# Patient Record
Sex: Female | Born: 1980 | Race: White | Hispanic: No | Marital: Married | Smoking: Never smoker
Health system: Southern US, Community
[De-identification: ages and names within clinical notes are randomized; demographics above are authoritative.]

---

## 2018-04-05 DIAGNOSIS — J302 Other seasonal allergic rhinitis: Secondary | ICD-10-CM | POA: Insufficient documentation

## 2019-08-27 DIAGNOSIS — J302 Other seasonal allergic rhinitis: Secondary | ICD-10-CM | POA: Insufficient documentation

## 2020-06-24 DIAGNOSIS — Z0000 Encounter for general adult medical examination without abnormal findings: Secondary | ICD-10-CM | POA: Insufficient documentation

## 2021-06-24 ENCOUNTER — Ambulatory Visit (INDEPENDENT_AMBULATORY_CARE_PROVIDER_SITE_OTHER): Payer: 59 | Admitting: Family Medicine

## 2021-06-24 ENCOUNTER — Other Ambulatory Visit: Payer: Self-pay | Admitting: *Deleted

## 2021-06-24 ENCOUNTER — Encounter: Payer: Self-pay | Admitting: Family Medicine

## 2021-06-24 VITALS — BP 128/83 | HR 85 | Temp 98.0°F | Resp 16 | Ht 65.0 in | Wt 151.6 lb

## 2021-06-24 DIAGNOSIS — Z7689 Persons encountering health services in other specified circumstances: Secondary | ICD-10-CM

## 2021-06-24 DIAGNOSIS — Z13228 Encounter for screening for other metabolic disorders: Secondary | ICD-10-CM

## 2021-06-24 DIAGNOSIS — Z0000 Encounter for general adult medical examination without abnormal findings: Secondary | ICD-10-CM | POA: Diagnosis not present

## 2021-06-24 DIAGNOSIS — Z1329 Encounter for screening for other suspected endocrine disorder: Secondary | ICD-10-CM

## 2021-06-24 DIAGNOSIS — Z130 Encounter for screening for diseases of the blood and blood-forming organs and certain disorders involving the immune mechanism: Secondary | ICD-10-CM | POA: Diagnosis not present

## 2021-06-24 DIAGNOSIS — Z13220 Encounter for screening for lipoid disorders: Secondary | ICD-10-CM

## 2021-06-24 DIAGNOSIS — Z1231 Encounter for screening mammogram for malignant neoplasm of breast: Secondary | ICD-10-CM

## 2021-06-24 NOTE — Progress Notes (Signed)
Patient is here to est care with provider. Patient has no concerns at the moment. ?

## 2021-06-25 ENCOUNTER — Encounter: Payer: Self-pay | Admitting: Family Medicine

## 2021-06-25 LAB — CBC WITH DIFFERENTIAL/PLATELET
Basophils Absolute: 0.1 10*3/uL (ref 0.0–0.2)
Basos: 1 %
EOS (ABSOLUTE): 0.2 10*3/uL (ref 0.0–0.4)
Eos: 2 %
Hematocrit: 43.1 % (ref 34.0–46.6)
Hemoglobin: 14.2 g/dL (ref 11.1–15.9)
Immature Grans (Abs): 0 10*3/uL (ref 0.0–0.1)
Immature Granulocytes: 0 %
Lymphocytes Absolute: 2.5 10*3/uL (ref 0.7–3.1)
Lymphs: 28 %
MCH: 29.6 pg (ref 26.6–33.0)
MCHC: 32.9 g/dL (ref 31.5–35.7)
MCV: 90 fL (ref 79–97)
Monocytes Absolute: 0.6 10*3/uL (ref 0.1–0.9)
Monocytes: 6 %
Neutrophils Absolute: 5.7 10*3/uL (ref 1.4–7.0)
Neutrophils: 63 %
Platelets: 236 10*3/uL (ref 150–450)
RBC: 4.79 x10E6/uL (ref 3.77–5.28)
RDW: 12 % (ref 11.7–15.4)
WBC: 9 10*3/uL (ref 3.4–10.8)

## 2021-06-25 LAB — LIPID PANEL
Chol/HDL Ratio: 3 ratio (ref 0.0–4.4)
Cholesterol, Total: 201 mg/dL — ABNORMAL HIGH (ref 100–199)
HDL: 67 mg/dL (ref >39)
LDL Chol Calc (NIH): 112 mg/dL — ABNORMAL HIGH (ref 0–99)
Triglycerides: 123 mg/dL (ref 0–149)
VLDL Cholesterol Cal: 22 mg/dL (ref 5–40)

## 2021-06-25 LAB — CMP14+EGFR
ALT: 9 IU/L (ref 0–32)
AST: 15 IU/L (ref 0–40)
Albumin/Globulin Ratio: 2.5 — ABNORMAL HIGH (ref 1.2–2.2)
Albumin: 4.9 g/dL — ABNORMAL HIGH (ref 3.8–4.8)
Alkaline Phosphatase: 50 IU/L (ref 44–121)
BUN/Creatinine Ratio: 13 (ref 9–23)
BUN: 10 mg/dL (ref 6–24)
Bilirubin Total: 0.4 mg/dL (ref 0.0–1.2)
CO2: 23 mmol/L (ref 20–29)
Calcium: 9.4 mg/dL (ref 8.7–10.2)
Chloride: 104 mmol/L (ref 96–106)
Creatinine, Ser: 0.75 mg/dL (ref 0.57–1.00)
Globulin, Total: 2 g/dL (ref 1.5–4.5)
Glucose: 82 mg/dL (ref 70–99)
Potassium: 4.6 mmol/L (ref 3.5–5.2)
Sodium: 141 mmol/L (ref 134–144)
Total Protein: 6.9 g/dL (ref 6.0–8.5)
eGFR: 103 mL/min/{1.73_m2} (ref >59)

## 2021-06-25 LAB — TSH: TSH: 2.59 u[IU]/mL (ref 0.450–4.500)

## 2021-06-25 LAB — HEMOGLOBIN A1C
Est. average glucose Bld gHb Est-mCnc: 105 mg/dL
Hgb A1c MFr Bld: 5.3 % (ref 4.8–5.6)

## 2021-06-25 NOTE — Progress Notes (Signed)
? ?New Patient Office Visit ? ?Subjective   ? ?Patient ID: Brandy Padilla, female    DOB: 11-Sep-1980  Age: 41 y.o. MRN: [id number] ? ?CC:  ?Chief Complaint  ?Patient presents with  ? Establish Care  ? ? ?HPI ?Brandy Padilla presents to establish care and for a routine annual exam.  ? ? ?Outpatient Encounter Medications as of 06/24/2021  ?Medication Sig  ? benzonatate (TESSALON) 100 MG capsule take 1 capsule by oral route 3 times every day as needed for cough  ? cetirizine (ZYRTEC) 10 MG tablet   ? norgestimate-ethinyl estradiol (ORTHO-CYCLEN) 0.25-35 MG-MCG tablet Take 1 tablet by mouth daily.  ? ?No facility-administered encounter medications on file as of 06/24/2021.  ? ? ?History reviewed. No pertinent past medical history. ? ?History reviewed. No pertinent surgical history. ? ?Family History  ?Problem Relation Age of Onset  ? Alcohol abuse Father   ? Depression Father   ? Diabetes Maternal Grandmother   ? Cancer Paternal Grandmother   ? ? ?Social History  ? ?Socioeconomic History  ? Marital status: Married  ?  Spouse name: Not on file  ? Number of children: Not on file  ? Years of education: Not on file  ? Highest education level: Not on file  ?Occupational History  ? Not on file  ?Tobacco Use  ? Smoking status: Never  ? Smokeless tobacco: Not on file  ?Substance and Sexual Activity  ? Alcohol use: Yes  ?  Alcohol/week: 1.0 standard drink  ?  Types: 1 Cans of beer per week  ? Drug use: Never  ? Sexual activity: Yes  ?  Birth control/protection: Condom, Pill  ?  Comment: Married. One partner.  ?Other Topics Concern  ? Not on file  ?Social History Narrative  ? Not on file  ? ?Social Determinants of Health  ? ?Financial Resource Strain: Not on file  ?Food Insecurity: Not on file  ?Transportation Needs: Not on file  ?Physical Activity: Not on file  ?Stress: Not on file  ?Social Connections: Not on file  ?Intimate Partner Violence: Not on file  ? ? ?Review of Systems  ?All other systems reviewed and  are negative. ? ?  ? ? ?Objective   ? ?BP 128/83   Pulse 85   Temp 98 ?F (36.7 ?C) (Oral)   Resp 16   Ht 5' 5"  (1.651 m)   Wt 151 lb 9.6 oz (68.8 kg)   SpO2 98%   BMI 25.23 kg/m?  ? ?Physical Exam ?Vitals and nursing note reviewed.  ?Constitutional:   ?   General: She is not in acute distress. ?Cardiovascular:  ?   Rate and Rhythm: Normal rate and regular rhythm.  ?Pulmonary:  ?   Effort: Pulmonary effort is normal.  ?   Breath sounds: Normal breath sounds.  ?Abdominal:  ?   Palpations: Abdomen is soft.  ?   Tenderness: There is no abdominal tenderness.  ?Neurological:  ?   General: No focal deficit present.  ?   Mental Status: She is alert and oriented to person, place, and time.  ? ? ? ?  ? ?Assessment & Plan:  ? ?1. Annual physical exam ?Routine labs ordered ?- CMP14+EGFR ? ?2. Screening for deficiency anemia ? ?- CBC with Differential ? ?3. Screening for endocrine/metabolic/immunity disorders ? ?- TSH ?- Hemoglobin A1c ? ?4. Screening for lipid disorders ? ?- Lipid Panel ? ?5. Encounter for screening mammogram for malignant neoplasm of breast ?Referral for mammogram ?- MM  Digital Screening; Future ? ?6. Encounter to establish care ? ? ? ? ?No follow-ups on file.  ? ?Becky Sax, MD ? ? ?

## 2021-07-01 ENCOUNTER — Other Ambulatory Visit: Payer: Self-pay | Admitting: Family Medicine

## 2021-07-01 DIAGNOSIS — Z1231 Encounter for screening mammogram for malignant neoplasm of breast: Secondary | ICD-10-CM

## 2021-07-06 ENCOUNTER — Ambulatory Visit
Admission: RE | Admit: 2021-07-06 | Discharge: 2021-07-06 | Disposition: A | Discharge status: Home / Self Care | Payer: 59 | Source: Ambulatory Visit | Attending: Family Medicine | Admitting: Family Medicine

## 2021-07-06 DIAGNOSIS — Z1231 Encounter for screening mammogram for malignant neoplasm of breast: Secondary | ICD-10-CM

## 2021-08-26 ENCOUNTER — Other Ambulatory Visit: Payer: Self-pay | Admitting: Family Medicine

## 2021-08-27 ENCOUNTER — Other Ambulatory Visit (HOSPITAL_COMMUNITY): Payer: Self-pay

## 2021-08-27 MED ORDER — NORGESTIMATE-ETH ESTRADIOL 0.25-35 MG-MCG PO TABS
1.0000 | ORAL_TABLET | Freq: Every day | ORAL | 11 refills | Status: DC
Start: 2021-08-27 — End: 2022-09-22
  Filled 2021-08-27 – 2021-12-22 (×4): qty 28, 28d supply, fill #0

## 2021-09-02 ENCOUNTER — Other Ambulatory Visit (HOSPITAL_COMMUNITY): Payer: Self-pay

## 2021-12-22 ENCOUNTER — Other Ambulatory Visit (HOSPITAL_COMMUNITY): Payer: Self-pay

## 2021-12-24 ENCOUNTER — Other Ambulatory Visit (HOSPITAL_COMMUNITY): Payer: Self-pay

## 2022-04-29 ENCOUNTER — Encounter: Payer: Self-pay | Admitting: Family Medicine

## 2022-05-07 IMAGING — MG MM DIGITAL SCREENING BILAT W/ TOMO AND CAD
6 of 10 series · 6 of 30 positions shown · non-contrast
Comparison: Previous exam 06/19/2020 from [place], [place].

CLINICAL DATA: Screening.

EXAM:
DIGITAL SCREENING BILATERAL MAMMOGRAM WITH TOMOSYNTHESIS AND CAD
TECHNIQUE: Bilateral screening digital craniocaudal and mediolateral oblique
mammograms were obtained. Bilateral screening digital breast
tomosynthesis was performed. The images were evaluated with
computer-aided detection.

[L MLO synth-2D]
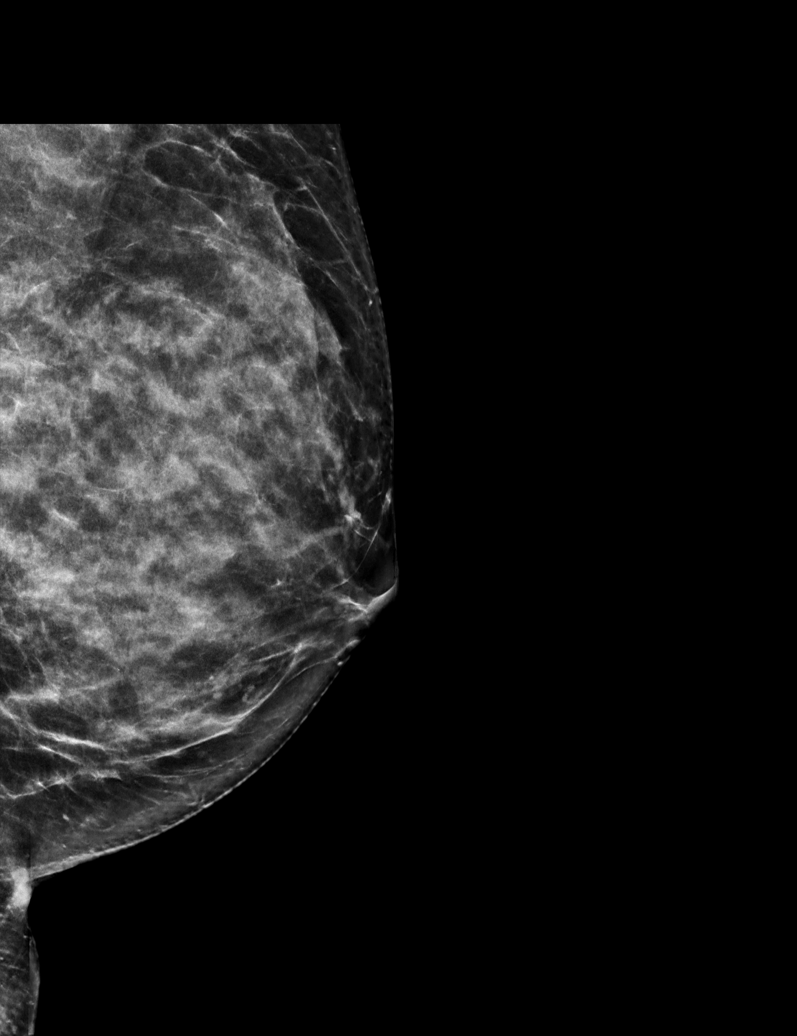

[R MLO synth-2D]
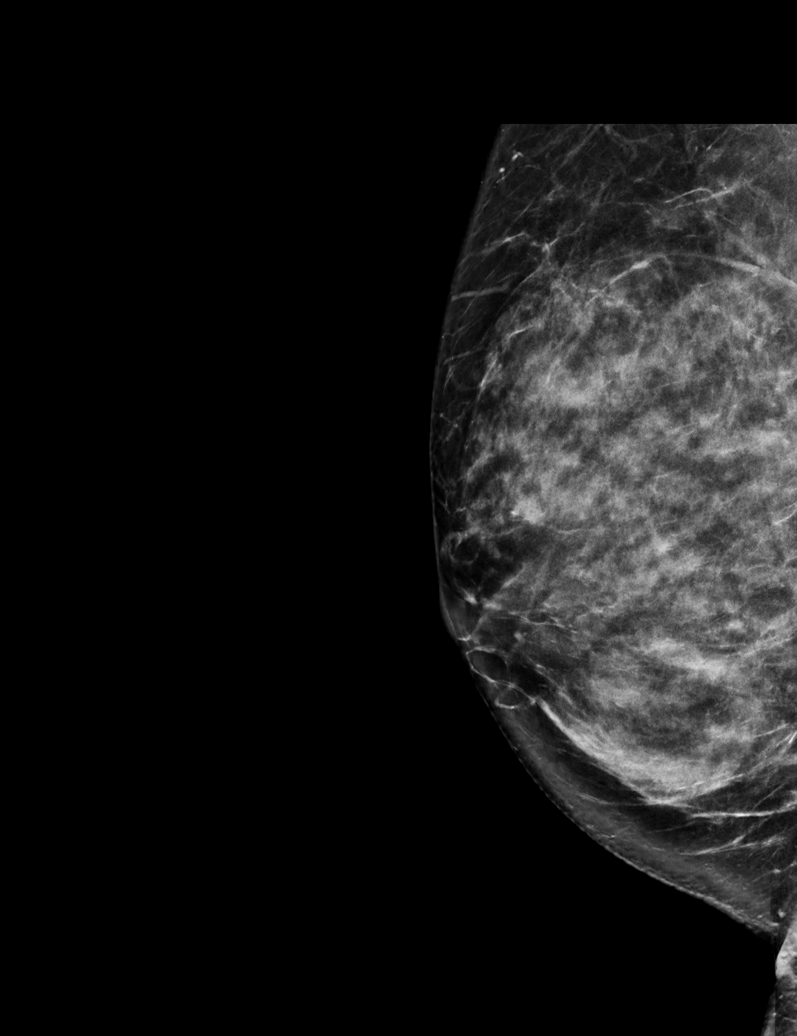

[R CC synth-2D (1 of 2)]
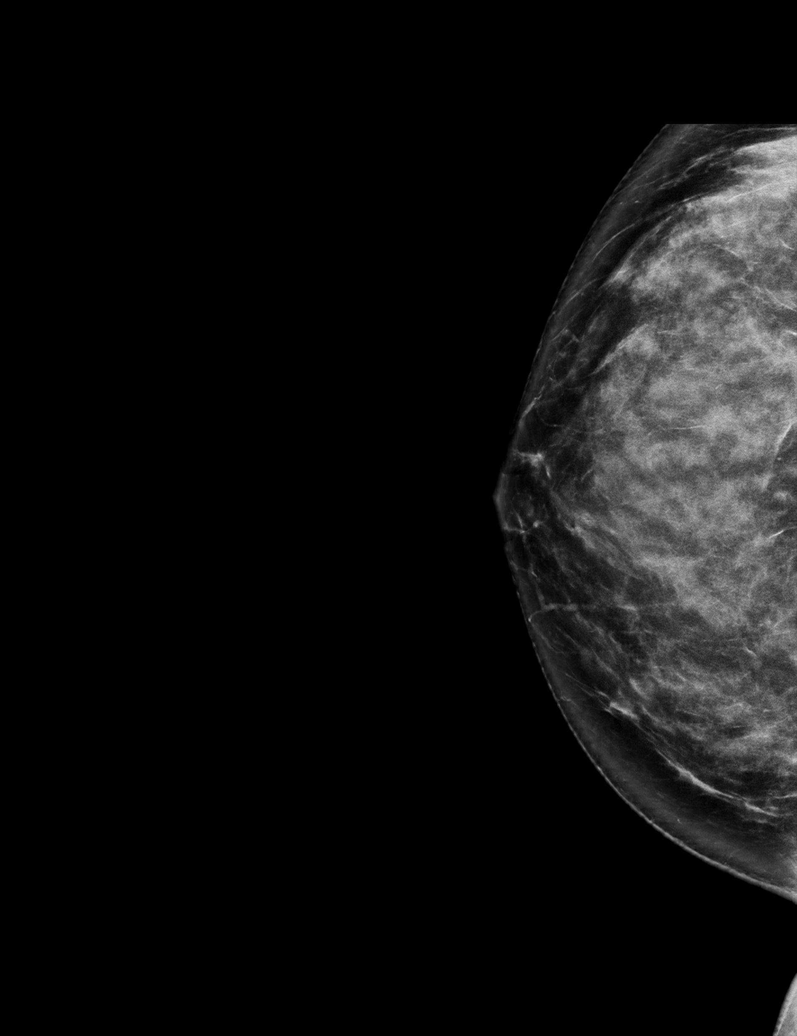

[L CC synth-2D]
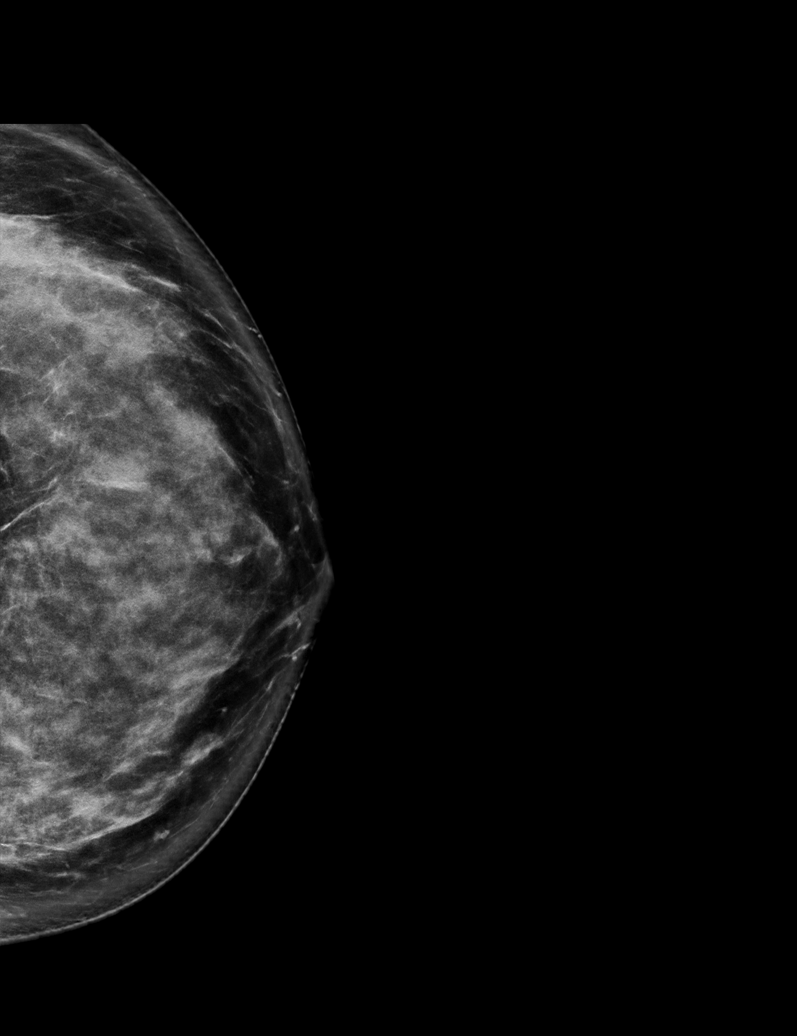

[R CC synth-2D (2 of 2)]
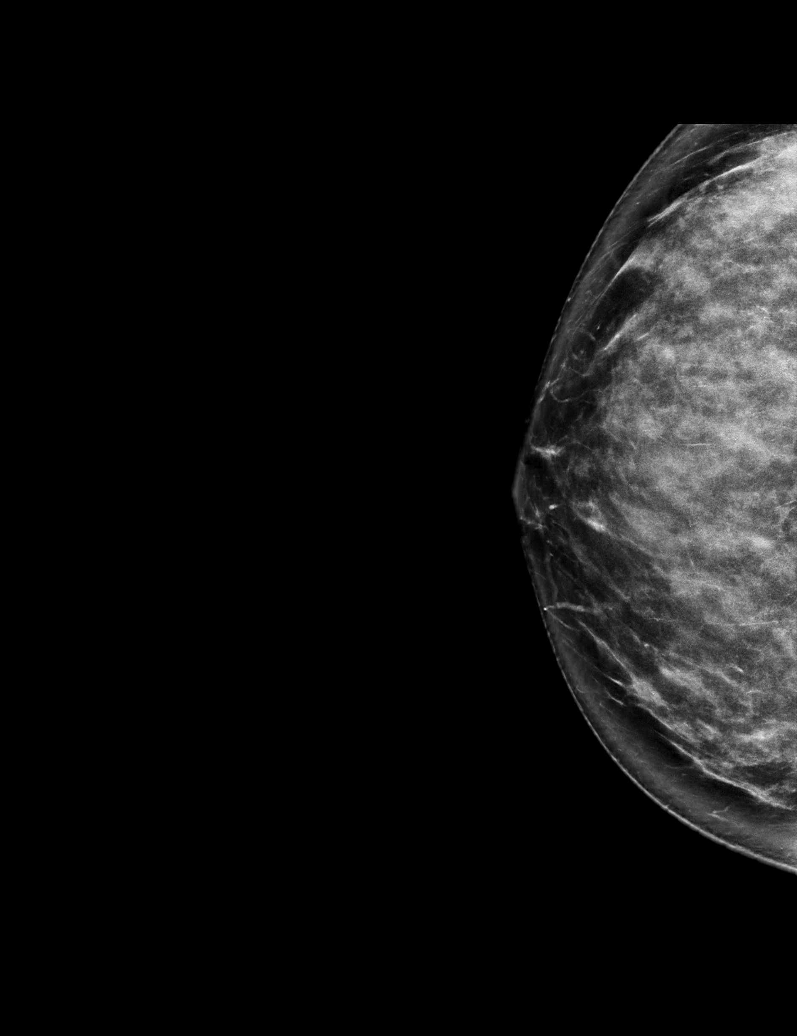

[R CC tomo · tomo slice 37/73.0]
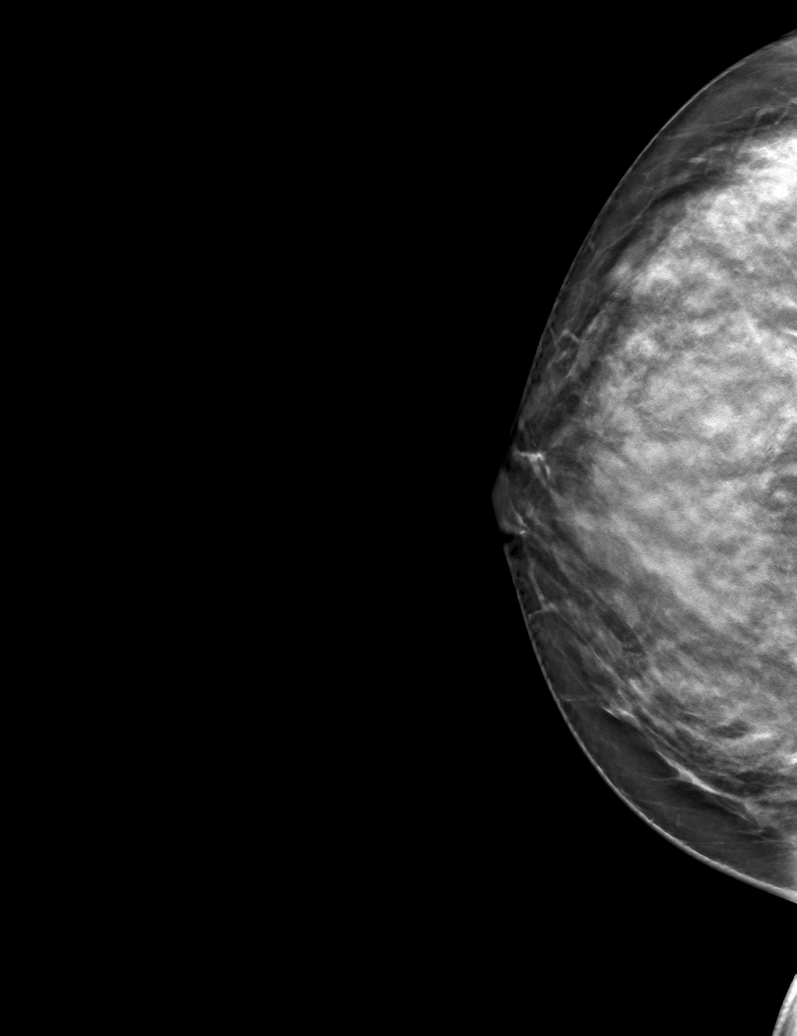

[6 of 30 positions shown; findings below may reference images not displayed]

Awaiting
the prior outside mammograms accounts for the delay in this report.

ACR Breast Density Category d: The breast tissue is extremely dense,
which lowers the sensitivity of mammography
FINDINGS: There are no findings suspicious for malignancy.
IMPRESSION: No mammographic evidence of malignancy. A result letter of this
screening mammogram will be mailed directly to the patient.

RECOMMENDATION:
Screening mammogram in one year. (Code:[id number]-H-0IL)

BI-RADS CATEGORY  1: Negative.

## 2022-06-29 ENCOUNTER — Other Ambulatory Visit: Payer: Self-pay | Admitting: Family Medicine

## 2022-06-29 DIAGNOSIS — Z0000 Encounter for general adult medical examination without abnormal findings: Secondary | ICD-10-CM

## 2022-06-30 ENCOUNTER — Ambulatory Visit (INDEPENDENT_AMBULATORY_CARE_PROVIDER_SITE_OTHER): Payer: 59 | Admitting: Family Medicine

## 2022-06-30 ENCOUNTER — Other Ambulatory Visit (HOSPITAL_COMMUNITY)
Admission: RE | Admit: 2022-06-30 | Discharge: 2022-06-30 | Disposition: A | Discharge status: Home / Self Care | Payer: 59 | Source: Ambulatory Visit | Attending: Family Medicine | Admitting: Family Medicine

## 2022-06-30 ENCOUNTER — Encounter: Payer: Self-pay | Admitting: Family Medicine

## 2022-06-30 VITALS — BP 122/81 | HR 76 | Temp 98.1°F | Resp 16 | Ht 65.0 in | Wt 153.0 lb

## 2022-06-30 DIAGNOSIS — Z0000 Encounter for general adult medical examination without abnormal findings: Secondary | ICD-10-CM

## 2022-06-30 DIAGNOSIS — Z13220 Encounter for screening for lipoid disorders: Secondary | ICD-10-CM | POA: Diagnosis not present

## 2022-06-30 DIAGNOSIS — Z1159 Encounter for screening for other viral diseases: Secondary | ICD-10-CM | POA: Diagnosis not present

## 2022-06-30 DIAGNOSIS — Z01419 Encounter for gynecological examination (general) (routine) without abnormal findings: Secondary | ICD-10-CM

## 2022-06-30 DIAGNOSIS — Z114 Encounter for screening for human immunodeficiency virus [HIV]: Secondary | ICD-10-CM | POA: Diagnosis not present

## 2022-06-30 DIAGNOSIS — Z130 Encounter for screening for diseases of the blood and blood-forming organs and certain disorders involving the immune mechanism: Secondary | ICD-10-CM | POA: Diagnosis not present

## 2022-07-01 LAB — CBC WITH DIFFERENTIAL/PLATELET
Basophils Absolute: 0.1 10*3/uL (ref 0.0–0.2)
Basos: 1 %
EOS (ABSOLUTE): 0.2 10*3/uL (ref 0.0–0.4)
Eos: 2 %
Hematocrit: 47.5 % — ABNORMAL HIGH (ref 34.0–46.6)
Hemoglobin: 15.6 g/dL (ref 11.1–15.9)
Immature Grans (Abs): 0 10*3/uL (ref 0.0–0.1)
Immature Granulocytes: 0 %
Lymphocytes Absolute: 2.1 10*3/uL (ref 0.7–3.1)
Lymphs: 29 %
MCH: 29.5 pg (ref 26.6–33.0)
MCHC: 32.8 g/dL (ref 31.5–35.7)
MCV: 90 fL (ref 79–97)
Monocytes Absolute: 0.4 10*3/uL (ref 0.1–0.9)
Monocytes: 5 %
Neutrophils Absolute: 4.6 10*3/uL (ref 1.4–7.0)
Neutrophils: 63 %
Platelets: 266 10*3/uL (ref 150–450)
RBC: 5.28 x10E6/uL (ref 3.77–5.28)
RDW: 12.4 % (ref 11.7–15.4)
WBC: 7.3 10*3/uL (ref 3.4–10.8)

## 2022-07-01 LAB — CERVICOVAGINAL ANCILLARY ONLY
Bacterial Vaginitis (gardnerella): NEGATIVE
Candida Glabrata: NEGATIVE
Candida Vaginitis: NEGATIVE
Chlamydia: NEGATIVE
Comment: NEGATIVE
Comment: NEGATIVE
Comment: NEGATIVE
Comment: NEGATIVE
Comment: NEGATIVE
Comment: NORMAL
Neisseria Gonorrhea: NEGATIVE
Trichomonas: NEGATIVE

## 2022-07-01 LAB — CMP14+EGFR
ALT: 10 IU/L (ref 0–32)
AST: 14 IU/L (ref 0–40)
Albumin/Globulin Ratio: 2.1 (ref 1.2–2.2)
Albumin: 5.1 g/dL — ABNORMAL HIGH (ref 3.9–4.9)
Alkaline Phosphatase: 53 IU/L (ref 44–121)
BUN/Creatinine Ratio: 10 (ref 9–23)
BUN: 9 mg/dL (ref 6–24)
Bilirubin Total: 0.4 mg/dL (ref 0.0–1.2)
CO2: 19 mmol/L — ABNORMAL LOW (ref 20–29)
Calcium: 9.5 mg/dL (ref 8.7–10.2)
Chloride: 103 mmol/L (ref 96–106)
Creatinine, Ser: 0.86 mg/dL (ref 0.57–1.00)
Globulin, Total: 2.4 g/dL (ref 1.5–4.5)
Glucose: 85 mg/dL (ref 70–99)
Potassium: 4.1 mmol/L (ref 3.5–5.2)
Sodium: 141 mmol/L (ref 134–144)
Total Protein: 7.5 g/dL (ref 6.0–8.5)
eGFR: 86 mL/min/{1.73_m2} (ref >59)

## 2022-07-01 LAB — HIV ANTIBODY (ROUTINE TESTING W REFLEX): HIV Screen 4th Generation wRfx: NONREACTIVE

## 2022-07-01 LAB — HEPATITIS C ANTIBODY: Hep C Virus Ab: NONREACTIVE

## 2022-07-05 ENCOUNTER — Encounter: Payer: Self-pay | Admitting: Family Medicine

## 2022-07-05 LAB — CYTOLOGY - PAP: Diagnosis: NEGATIVE

## 2022-07-05 NOTE — Progress Notes (Signed)
Established Patient Office Visit  Subjective    Patient ID: Brandy Padilla, female    DOB: May 13, 1980  Age: 42 y.o. MRN: [id number]  CC:  Chief Complaint  Patient presents with   Annual Exam   Gynecologic Exam    HPI Brandy Padilla presents for routine annual exam. Patient denies acute complaints or concerns.    Outpatient Encounter Medications as of 06/30/2022  Medication Sig   benzonatate (TESSALON) 100 MG capsule take 1 capsule by oral route 3 times every day as needed for cough   cetirizine (ZYRTEC) 10 MG tablet    norgestimate-ethinyl estradiol (ORTHO-CYCLEN) 0.25-35 MG-MCG tablet Take 1 tablet by mouth daily.   No facility-administered encounter medications on file as of 06/30/2022.    History reviewed. No pertinent past medical history.  History reviewed. No pertinent surgical history.  Family History  Problem Relation Age of Onset   Alcohol abuse Father    Depression Father    Diabetes Maternal Grandmother    Breast cancer Paternal Grandmother    Cancer Paternal Grandmother     Social History   Socioeconomic History   Marital status: Married    Spouse name: Not on file   Number of children: Not on file   Years of education: Not on file   Highest education level: Bachelor's degree (e.g., BA, AB, BS)  Occupational History   Not on file  Tobacco Use   Smoking status: Never   Smokeless tobacco: Never  Substance and Sexual Activity   Alcohol use: Yes    Alcohol/week: 1.0 standard drink of alcohol    Types: 1 Cans of beer per week   Drug use: Never   Sexual activity: Yes    Birth control/protection: Condom, Pill    Comment: Married. One partner.  Other Topics Concern   Not on file  Social History Narrative   Not on file   Social Determinants of Health   Financial Resource Strain: Low Risk  (06/26/2022)   Overall Financial Resource Strain (CARDIA)    Difficulty of Paying Living Expenses: Not hard at all  Food Insecurity: No  Food Insecurity (06/26/2022)   Hunger Vital Sign    Worried About Running Out of Food in the Last Year: Never true    Ran Out of Food in the Last Year: Never true  Transportation Needs: No Transportation Needs (06/26/2022)   PRAPARE - Administrator, Civil Service (Medical): No    Lack of Transportation (Non-Medical): No  Physical Activity: Insufficiently Active (06/26/2022)   Exercise Vital Sign    Days of Exercise per Week: 5 days    Minutes of Exercise per Session: 20 min  Stress: No Stress Concern Present (06/26/2022)   [employer] of Occupational Health - Occupational Stress Questionnaire    Feeling of Stress : Only a little  Social Connections: Socially Integrated (06/26/2022)   Social Connection and Isolation Panel [NHANES]    Frequency of Communication with Friends and Family: Three times a week    Frequency of Social Gatherings with Friends and Family: More than three times a week    Attends Religious Services: More than 4 times per year    Active Member of [employer] or Organizations: Yes    Attends Engineer, structural: More than 4 times per year    Marital Status: Married  Catering manager Violence: Not on file    Review of Systems  All other systems reviewed and are negative.  Objective    BP 122/81   Pulse 76   Temp 98.1 F (36.7 C) (Oral)   Resp 16   Ht 5\' 5"  (1.651 m)   Wt 153 lb (69.4 kg)   SpO2 98%   BMI 25.46 kg/m   Physical Exam Vitals and nursing note reviewed.  Constitutional:      General: She is not in acute distress. HENT:     Head: Normocephalic and atraumatic.     Right Ear: Tympanic membrane, ear canal and external ear normal.     Left Ear: Tympanic membrane, ear canal and external ear normal.     Nose: Nose normal.     Mouth/Throat:     Mouth: Mucous membranes are moist.     Pharynx: Oropharynx is clear.  Eyes:     Conjunctiva/sclera: Conjunctivae normal.     Pupils: Pupils are equal, round, and reactive to  light.  Neck:     Thyroid: No thyromegaly.  Cardiovascular:     Rate and Rhythm: Normal rate and regular rhythm.     Heart sounds: Normal heart sounds. No murmur heard. Pulmonary:     Effort: Pulmonary effort is normal. No respiratory distress.     Breath sounds: Normal breath sounds.  Abdominal:     General: There is no distension.     Palpations: Abdomen is soft. There is no mass.     Tenderness: There is no abdominal tenderness.  Musculoskeletal:        General: Normal range of motion.     Cervical back: Normal range of motion and neck supple.  Skin:    General: Skin is warm and dry.  Neurological:     General: No focal deficit present.     Mental Status: She is alert and oriented to person, place, and time.  Psychiatric:        Mood and Affect: Mood normal.        Behavior: Behavior normal.         Assessment & Plan:   1. Annual physical exam  - CMP14+EGFR  2. Screening for deficiency anemia  - CBC with Differential  3. Screening for lipid disorders   4. Screening for HIV (human immunodeficiency virus)  - HIV Antibody (routine testing w rflx)  5. Need for hepatitis C screening test  - Hepatitis C antibody  6. Pap smear, as part of routine gynecological examination  - Cytology - PAP - Cervicovaginal ancillary only  Return in about 1 year (around 06/30/2023) for physical.   Tommie Raymond, MD

## 2022-07-08 ENCOUNTER — Ambulatory Visit
Admission: RE | Admit: 2022-07-08 | Discharge: 2022-07-08 | Disposition: A | Discharge status: Home / Self Care | Payer: 59 | Source: Ambulatory Visit | Attending: Family Medicine | Admitting: Family Medicine

## 2022-07-08 DIAGNOSIS — Z0000 Encounter for general adult medical examination without abnormal findings: Secondary | ICD-10-CM

## 2022-09-21 ENCOUNTER — Other Ambulatory Visit: Payer: Self-pay | Admitting: Family Medicine

## 2023-01-06 ENCOUNTER — Ambulatory Visit
Admission: EM | Admit: 2023-01-06 | Discharge: 2023-01-06 | Disposition: A | Discharge status: Home / Self Care | Payer: 59

## 2023-01-06 ENCOUNTER — Other Ambulatory Visit: Payer: Self-pay

## 2023-01-06 ENCOUNTER — Emergency Department (HOSPITAL_COMMUNITY)
Admission: EM | Admit: 2023-01-06 | Discharge: 2023-01-06 | Disposition: A | Discharge status: Home / Self Care | Payer: 59 | Attending: Emergency Medicine | Admitting: Emergency Medicine

## 2023-01-06 ENCOUNTER — Ambulatory Visit: Payer: 59

## 2023-01-06 ENCOUNTER — Emergency Department (HOSPITAL_COMMUNITY): Payer: 59

## 2023-01-06 ENCOUNTER — Encounter (HOSPITAL_COMMUNITY): Payer: Self-pay

## 2023-01-06 DIAGNOSIS — R059 Cough, unspecified: Secondary | ICD-10-CM

## 2023-01-06 DIAGNOSIS — R911 Solitary pulmonary nodule: Secondary | ICD-10-CM | POA: Diagnosis not present

## 2023-01-06 DIAGNOSIS — Z20822 Contact with and (suspected) exposure to covid-19: Secondary | ICD-10-CM | POA: Insufficient documentation

## 2023-01-06 DIAGNOSIS — Q859 Phakomatosis, unspecified: Secondary | ICD-10-CM

## 2023-01-06 DIAGNOSIS — R051 Acute cough: Secondary | ICD-10-CM

## 2023-01-06 DIAGNOSIS — J189 Pneumonia, unspecified organism: Secondary | ICD-10-CM | POA: Diagnosis not present

## 2023-01-06 DIAGNOSIS — Q8581 Pten hamartoma tumor syndrome: Secondary | ICD-10-CM | POA: Insufficient documentation

## 2023-01-06 LAB — CBC WITH DIFFERENTIAL/PLATELET
Abs Immature Granulocytes: 0.04 10*3/uL (ref 0.00–0.07)
Basophils Absolute: 0 10*3/uL (ref 0.0–0.1)
Basophils Relative: 0 %
Eosinophils Absolute: 0.1 10*3/uL (ref 0.0–0.5)
Eosinophils Relative: 1 %
HCT: 39.8 % (ref 36.0–46.0)
Hemoglobin: 13.2 g/dL (ref 12.0–15.0)
Immature Granulocytes: 0 %
Lymphocytes Relative: 12 %
Lymphs Abs: 1.1 10*3/uL (ref 0.7–4.0)
MCH: 29.2 pg (ref 26.0–34.0)
MCHC: 33.2 g/dL (ref 30.0–36.0)
MCV: 88.1 fL (ref 80.0–100.0)
Monocytes Absolute: 1 10*3/uL (ref 0.1–1.0)
Monocytes Relative: 10 %
Neutro Abs: 7.1 10*3/uL (ref 1.7–7.7)
Neutrophils Relative %: 77 %
Platelets: 181 10*3/uL (ref 150–400)
RBC: 4.52 MIL/uL (ref 3.87–5.11)
RDW: 12.3 % (ref 11.5–15.5)
WBC: 9.3 10*3/uL (ref 4.0–10.5)
nRBC: 0 % (ref 0.0–0.2)

## 2023-01-06 LAB — BASIC METABOLIC PANEL
Anion gap: 9 (ref 5–15)
BUN: 9 mg/dL (ref 6–20)
CO2: 21 mmol/L — ABNORMAL LOW (ref 22–32)
Calcium: 8.7 mg/dL — ABNORMAL LOW (ref 8.9–10.3)
Chloride: 105 mmol/L (ref 98–111)
Creatinine, Ser: 0.84 mg/dL (ref 0.44–1.00)
GFR, Estimated: >60 mL/min (ref >60)
Glucose, Bld: 148 mg/dL — ABNORMAL HIGH (ref 70–99)
Potassium: 3.8 mmol/L (ref 3.5–5.1)
Sodium: 135 mmol/L (ref 135–145)

## 2023-01-06 LAB — SARS CORONAVIRUS 2 BY RT PCR: SARS Coronavirus 2 by RT PCR: NEGATIVE

## 2023-01-06 MED ORDER — SODIUM CHLORIDE 0.9 % IV SOLN
1.0000 g | Freq: Once | INTRAVENOUS | Status: AC
Start: 2023-01-06 — End: 2023-01-06
  Administered 2023-01-06: 1 g via INTRAVENOUS
  Filled 2023-01-06: qty 10

## 2023-01-06 MED ORDER — DOXYCYCLINE HYCLATE 100 MG PO CAPS
100.0000 mg | ORAL_CAPSULE | Freq: Two times a day (BID) | ORAL | 0 refills | Status: AC
Start: 2023-01-06 — End: 2023-01-11

## 2023-01-06 MED ORDER — IOHEXOL 350 MG/ML SOLN
50.0000 mL | Freq: Once | INTRAVENOUS | Status: AC | PRN
  Administered 2023-01-06: 50 mL via INTRAVENOUS

## 2023-01-06 MED ORDER — AZITHROMYCIN 250 MG PO TABS
500.0000 mg | ORAL_TABLET | Freq: Once | ORAL | Status: AC
  Administered 2023-01-06: 500 mg via ORAL
  Filled 2023-01-06: qty 2

## 2023-01-06 NOTE — ED Provider Notes (Signed)
[place] EMERGENCY DEPARTMENT AT [facility] Provider Note   CSN: [id number] Arrival date & time: 01/06/23  1809     History  Chief Complaint  Patient presents with   Cough    Brandy Padilla is a 42 y.o. female with no pertinent past medical history who presents to the ED from urgent care for pulmonary nodule and cough.  Patient has had 4 to 5 days of cough productive of yellow and brown mucus as well as subjective fevers, chills, generalized malaise.  No nausea vomiting.  No shortness of breath.  Has a cough at baseline for several years of unknown etiology.  No history of asthma.  Seen at urgent care where chest x-ray showed a previously undiagnosed nodule in the right upper lobe.  Sent to ED for further evaluation.  No history of VTE or VTE risk factors.  The history is provided by the patient and medical records.       Home Medications Prior to Admission medications   Medication Sig Start Date End Date Taking? Authorizing Provider  doxycycline (VIBRAMYCIN) 100 MG capsule Take 1 capsule (100 mg total) by mouth 2 (two) times daily for 5 days. 01/06/23 01/11/23 Yes Karmen Stabs, MD  benzonatate (TESSALON) 100 MG capsule Take 100 mg by mouth 3 (three) times daily. 12/12/14   [provider]  cetirizine (ZYRTEC) 10 MG tablet     [provider]  SPRINTEC 28 0.25-35 MG-MCG tablet TAKE 1 TABLET BY MOUTH DAILY 09/22/22   Georganna Skeans, MD  UNABLE TO FIND Med Name: OTC Cough medication.    [provider]      Allergies    Sulfa antibiotics    Review of Systems   Review of Systems per HPI above  Physical Exam Updated Vital Signs BP (!) 140/75   Pulse 90   Temp 98 F (36.7 C)   Resp 18   Ht 5\' 5"  (1.651 m)   Wt 72.6 kg   LMP 12/27/2022 (Exact Date)   SpO2 98%   BMI 26.63 kg/m  Physical Exam Constitutional:      Appearance: Normal appearance.  HENT:     Head: Normocephalic and atraumatic.     Nose: Nose normal.      Mouth/Throat:     Mouth: Mucous membranes are moist.  Eyes:     Extraocular Movements: Extraocular movements intact.     Pupils: Pupils are equal, round, and reactive to light.  Cardiovascular:     Rate and Rhythm: Normal rate and regular rhythm.     Pulses: Normal pulses.     Heart sounds: Normal heart sounds.  Pulmonary:     Effort: Pulmonary effort is normal.     Breath sounds: Normal breath sounds.  Abdominal:     General: There is no distension.     Palpations: Abdomen is soft.     Tenderness: There is no abdominal tenderness. There is no guarding.  Musculoskeletal:     Cervical back: Neck supple.     Right lower leg: No edema.     Left lower leg: No edema.  Skin:    General: Skin is warm.     Capillary Refill: Capillary refill takes less than 2 seconds.  Neurological:     General: No focal deficit present.     Mental Status: She is alert.     Cranial Nerves: No cranial nerve deficit.     Sensory: No sensory deficit.     Motor: No weakness.  ED Results / Procedures / Treatments   Labs (all labs ordered are listed, but only abnormal results are displayed) Labs Reviewed  BASIC METABOLIC PANEL - Abnormal; Notable for the following components:      Result Value   CO2 21 (*)    Glucose, Bld 148 (*)    Calcium 8.7 (*)    All other components within normal limits  SARS CORONAVIRUS 2 BY RT PCR  CBC WITH DIFFERENTIAL/PLATELET  PREGNANCY, URINE    EKG None  Radiology CT Chest W Contrast  Result Date: 01/06/2023 CLINICAL DATA:  Cough lung nodule EXAM: CT CHEST WITH CONTRAST TECHNIQUE: Multidetector CT imaging of the chest was performed during intravenous contrast administration. RADIATION DOSE REDUCTION: This exam was performed according to the departmental dose-optimization program which includes automated exposure control, adjustment of the mA and/or kV according to patient size and/or use of iterative reconstruction technique. CONTRAST:  50mL OMNIPAQUE IOHEXOL  350 MG/ML SOLN COMPARISON:  Chest x-ray 01/06/2023 FINDINGS: Cardiovascular: Nonaneurysmal aorta. Normal cardiac size. No pericardial effusion Mediastinum/Nodes: Midline trachea. No thyroid mass. No suspicious lymph nodes. Esophagus within normal limits Lungs/Pleura: No pleural effusion or pneumothorax. Heterogeneous patchy consolidation in the left upper lobe consistent with pneumonia. 1.7 x 1.4 cm largely ossified nodule in the right upper lobe corresponding to radiographic abnormality. Upper Abdomen: No acute abnormality. Musculoskeletal: No chest wall abnormality. No acute or significant osseous findings. IMPRESSION: 1. Findings consistent with left upper lobe pneumonia. 2. 1.7 cm densely calcified right upper lobe pulmonary nodule with features suggestive of a hamartoma though other calcified lung nodule not excluded. Consider CT follow-up in 6-12 months to exclude growth or change in appearance. Electronically Signed   By: Jasmine Pang M.D.   On: 01/06/2023 23:06   DG Chest 2 View  Result Date: 01/06/2023 CLINICAL DATA:  Cough. EXAM: CHEST - 2 VIEW COMPARISON:  3 hours ago FINDINGS: Question of developing airspace disease in the left mid lung. Stable nodular density in the right upper lung zone, possibly calcified. Exam is otherwise unchanged from 3 hours ago. Normal heart size and mediastinal contours. IMPRESSION: Question of developing airspace disease in the left mid lung. Again seen nodular density in the right upper lung zone for which outpatient chest CT characterization is recommended. Electronically Signed   By: Narda Rutherford M.D.   On: 01/06/2023 19:47   DG Chest 2 View  Result Date: 01/06/2023 CLINICAL DATA:  Cough. EXAM: CHEST - 2 VIEW COMPARISON:  None Available. FINDINGS: The cardiomediastinal contours are normal. Rounded 15 mm nodular density in the right upper lung zone projecting over the anterior right first rib, does not have the appearance of costochondral junction. This is  potentially calcified. Pulmonary vasculature is normal. No consolidation, pleural effusion, or pneumothorax. No acute osseous abnormalities are seen. IMPRESSION: 1. No acute findings or evidence of pneumonia. 2. Rounded 15 mm nodular density in the right upper lung zone, possibly calcified. Recommend further assessment with chest CT. This can be performed on an elective outpatient basis in the near future. Electronically Signed   By: Narda Rutherford M.D.   On: 01/06/2023 17:13    Procedures Procedures    Medications Ordered in ED Medications  cefTRIAXone (ROCEPHIN) 1 g in sodium chloride 0.9 % 100 mL IVPB (0 g Intravenous Stopped 01/06/23 2259)  azithromycin (ZITHROMAX) tablet 500 mg (500 mg Oral Given 01/06/23 2139)  iohexol (OMNIPAQUE) 350 MG/ML injection 50 mL (50 mLs Intravenous Contrast Given 01/06/23 2152)    ED Course/ Medical  Decision Making/ A&P                                 Medical Decision Making Amount and/or Complexity of Data Reviewed Labs: ordered. Decision-making details documented in ED Course. Radiology: ordered and independent interpretation performed. Decision-making details documented in ED Course.  Risk Prescription drug management.   42 year old female with no pertinent medical history presents with several days of cough, malaise, subjective fevers and an incidentally discovered pulmonary nodule on chest x-ray today at urgent care.  Differential considered includes benign pulmonary neoplasm, malignant pulmonary neoplasm (favor less likely given absence of B symptoms, risk factors, round shape and well-defined edges on chest x-ray), granuloma, lung abscess; scarring from prior infection (endemic fungi, Aspergillus); or much less likely TB (no risk factors or systemic symptoms; negative TB testing in 2013, no travel to at risk areas or housing with at risk populations since). For cough in in general with fever and malaise and also considering bacterial pneumonia,  viral pneumonia, throat infection.  Presentation is not consistent with anaphylaxis as etiology for cough.  Labs from triage area had been sent and are notable for normal WBC, hemoglobin, platelets, negative COVID flu, unremarkable metabolic panel values. CXR was obtained here and redemonstrates a roughly 2 cm well circumscribed possibly calcified pulmonary nodule.  Chest x-ray also showed evolving left midlung changes concerning for consolidative pneumonia in progress.  A CT chest with IV contrast was obtained to further characterize the nodule and shows a 1.7 cm calcified RUL nodule likely representative of a hamartoma. No concerning features to suggest malignancy or abscess. Chest CT does show findings of LUL pneumonia as suspected on CXR.   Given CTX, Azithromycin in ED. I discussed patient's imaging findings. Recommended PCP f/u for the lung nodule, repeat imaging in 6 mos - 1 year is likely indicated; and for pneumonia. Sent Rx doxycycline for CAP to preferred pharmacy. Discussed not to take with milk/calcium and to avoid sunlight. Strict return precautions discussed. Discharged in stable condition.            Final Clinical Impression(s) / ED Diagnoses Final diagnoses:  Acute cough  Pulmonary nodule  Community acquired pneumonia of left lung, unspecified part of lung  Hamartoma (HCC)    Rx / DC Orders ED Discharge Orders          Ordered    doxycycline (VIBRAMYCIN) 100 MG capsule  2 times daily        01/06/23 2308              Karmen Stabs, MD 01/06/23 Ouida Sills    Jacalyn Lefevre, MD 01/07/23 1910

## 2023-01-06 NOTE — ED Triage Notes (Signed)
"  I have had this cough for about 3 wks, I have a history of bad cough that at that time coughed up blood". No history of asthma. No history of copd. No runny nose (some congestion though). No sob or wheezing (known).

## 2023-01-06 NOTE — ED Notes (Signed)
Patient is being discharged from the Urgent Care and sent to the Emergency Department via Private Vehicle (Self) . Per R. Reggie Pile, patient is in need of higher level of care due to CXR Result (and need for CT) & Cough. Patient is aware and verbalizes understanding of plan of care.  Vitals:   01/06/23 1616  BP: 124/85  Pulse: (!) 106  Resp: 18  Temp: 99.5 F (37.5 C)  SpO2: 97%

## 2023-01-06 NOTE — Discharge Instructions (Addendum)
You were seen today for cough as well as a nodule that was seen on an x-ray at urgent care.  We did a CAT scan here and it shows that you have pneumonia in your left lung which we started treating with antibiotics here.  I am sending you with a prescription for doxycycline to complete treatment for your pneumonia.  Pick this up and take as prescribed.  Do not take with milk.  Avoid sunlight while on this medication.  Your CAT scan also showed that your nodule is likely benign (non-cancerous). It is not likely contributing to your symptoms. You should mention this to your PCP however, as these are typically followed with CT scans every 6-12 months.   Please see your PCP within 1 week for re-evaluation.

## 2023-01-06 NOTE — ED Triage Notes (Signed)
Pt c/o productive cough w/brownish/yellow mucousx5d. Pt c/o mid upper back painx3d.

## 2023-01-06 NOTE — ED Provider Notes (Signed)
[facility]    CSN: [id number] Arrival date & time: 01/06/23  1558      History   Chief Complaint Chief Complaint  Patient presents with   Cough    HPI Brandy Padilla is a 42 y.o. female.   Patient here today for evaluation of cough she has had for several weeks.  She reports that at 1 point she did cough up some blood.  She denies any history of asthma or COPD.  She has not any runny nose but has had some mild congestion.  She denies any shortness of breath or wheezing.  She does note over the last few days she has had some alternating body temperature but has not checked her temperature.  No recent travel other than to [place] a few months ago   Cough Associated symptoms: no chills, no eye discharge, no fever, no rhinorrhea, no shortness of breath and no wheezing     History reviewed. No pertinent past medical history.  Patient Active Problem List   Diagnosis Date Noted   Preventative health care 06/24/2020   Seasonal allergic rhinitis 08/27/2019    History reviewed. No pertinent surgical history.  OB History   No obstetric history on file.      Home Medications    Prior to Admission medications   Medication Sig Start Date End Date Taking? Authorizing Provider  SPRINTEC 28 0.25-35 MG-MCG tablet TAKE 1 TABLET BY MOUTH DAILY 09/22/22  Yes Georganna Skeans, MD  UNABLE TO FIND Med Name: OTC Cough medication.   Yes [provider]  benzonatate (TESSALON) 100 MG capsule take 1 capsule by oral route 3 times every day as needed for cough 12/12/14   [provider]  cetirizine (ZYRTEC) 10 MG tablet     [provider]    Family History Family History  Problem Relation Age of Onset   Alcohol abuse Father    Depression Father    Diabetes Maternal Grandmother    Breast cancer Paternal Grandmother    Cancer Paternal Grandmother     Social History Social History   Tobacco Use   Smoking status: Never   Smokeless  tobacco: Never  Vaping Use   Vaping status: Never Used  Substance Use Topics   Alcohol use: Yes    Alcohol/week: 1.0 standard drink of alcohol    Types: 1 Cans of beer per week    Comment: Occassionally.   Drug use: Never     Allergies   Sulfa antibiotics   Review of Systems Review of Systems  Constitutional:  Negative for chills, fever and unexpected weight change.  HENT:  Positive for congestion. Negative for rhinorrhea.   Eyes:  Negative for discharge and redness.  Respiratory:  Positive for cough. Negative for shortness of breath and wheezing.   Gastrointestinal:  Negative for nausea and vomiting.     Physical Exam Triage Vital Signs ED Triage Vitals  Encounter Vitals Group     BP 01/06/23 1616 124/85     Systolic BP Percentile --      Diastolic BP Percentile --      Pulse Rate 01/06/23 1616 (!) 106     Resp 01/06/23 1616 18     Temp 01/06/23 1616 99.5 F (37.5 C)     Temp Source 01/06/23 1616 Oral     SpO2 01/06/23 1616 97 %     Weight 01/06/23 1611 160 lb (72.6 kg)     Height 01/06/23 1611 5\' 5"  (1.651 m)  Head Circumference --      Peak Flow --      Pain Score 01/06/23 1611 0     Pain Loc --      Pain Education --      Exclude from Growth Chart --    No data found.  Updated Vital Signs BP 124/85 (BP Location: Right Arm)   Pulse (!) 106   Temp 99.5 F (37.5 C) (Oral)   Resp 18   Ht 5\' 5"  (1.651 m)   Wt 160 lb (72.6 kg)   LMP 12/27/2022 (Exact Date)   SpO2 97%   BMI 26.63 kg/m   Visual Acuity Right Eye Distance:   Left Eye Distance:   Bilateral Distance:    Right Eye Near:   Left Eye Near:    Bilateral Near:     Physical Exam Vitals and nursing note reviewed.  Constitutional:      General: She is not in acute distress.    Appearance: Normal appearance. She is not ill-appearing.  HENT:     Head: Normocephalic and atraumatic.  Eyes:     Conjunctiva/sclera: Conjunctivae normal.  Cardiovascular:     Rate and Rhythm: Normal rate.   Pulmonary:     Effort: Pulmonary effort is normal. No respiratory distress.  Neurological:     Mental Status: She is alert.      UC Treatments / Results  Labs (all labs ordered are listed, but only abnormal results are displayed) Labs Reviewed - No data to display  EKG   Radiology DG Chest 2 View  Result Date: 01/06/2023 CLINICAL DATA:  Cough. EXAM: CHEST - 2 VIEW COMPARISON:  None Available. FINDINGS: The cardiomediastinal contours are normal. Rounded 15 mm nodular density in the right upper lung zone projecting over the anterior right first rib, does not have the appearance of costochondral junction. This is potentially calcified. Pulmonary vasculature is normal. No consolidation, pleural effusion, or pneumothorax. No acute osseous abnormalities are seen. IMPRESSION: 1. No acute findings or evidence of pneumonia. 2. Rounded 15 mm nodular density in the right upper lung zone, possibly calcified. Recommend further assessment with chest CT. This can be performed on an elective outpatient basis in the near future. Electronically Signed   By: Narda Rutherford M.D.   On: 01/06/2023 17:13    Procedures Procedures (including critical care time)  Medications Ordered in UC Medications - No data to display  Initial Impression / Assessment and Plan / UC Course  I have reviewed the triage vital signs and the nursing notes.  Pertinent labs & imaging results that were available during my care of the patient were reviewed by me and considered in my medical decision making (see chart for details).   X-ray with 1.5 cm nodule to right upper lobe.  Given symptomatic, recommended further evaluation in the emergency room for stat CT of chest.  Patient expresses understanding.   Final Clinical Impressions(s) / UC Diagnoses   Final diagnoses:  Solitary pulmonary nodule   Discharge Instructions   None    ED Prescriptions   None    PDMP not reviewed this encounter.   Tomi Bamberger,  PA-C 01/06/23 1744

## 2023-01-07 ENCOUNTER — Telehealth: Payer: Self-pay | Admitting: Family Medicine

## 2023-01-07 NOTE — Telephone Encounter (Signed)
Called pt and left vm to call office back to schedule.

## 2023-01-07 NOTE — Telephone Encounter (Signed)
Called pt and left vm to call office back to schedule HFU appt with provider. Earliest HFU appt available is 11/19 at 10:40am

## 2023-01-07 NOTE — Telephone Encounter (Signed)
Pt is calling to schedule HFU No Appts please advise. [phone]

## 2023-01-11 ENCOUNTER — Telehealth: Payer: Self-pay | Admitting: Family Medicine

## 2023-01-11 NOTE — Telephone Encounter (Signed)
Called pt and left vm to call office back to schedule appt requested via mychart for follow up to ER visit/pneumonia diagnosis

## 2023-01-12 ENCOUNTER — Telehealth: Payer: Self-pay | Admitting: Family Medicine

## 2023-01-19 ENCOUNTER — Encounter: Payer: Self-pay | Admitting: Family Medicine

## 2023-01-19 ENCOUNTER — Ambulatory Visit: Payer: 59 | Admitting: Family Medicine

## 2023-01-19 VITALS — BP 131/86 | HR 79 | Temp 98.9°F | Resp 18 | Ht 65.0 in | Wt 156.6 lb

## 2023-01-19 DIAGNOSIS — R053 Chronic cough: Secondary | ICD-10-CM

## 2023-01-19 MED ORDER — METHYLPREDNISOLONE 4 MG PO TBPK: ORAL_TABLET | ORAL | 0 refills | Status: DC

## 2023-01-25 ENCOUNTER — Encounter: Payer: Self-pay | Admitting: Family Medicine

## 2023-01-25 NOTE — Progress Notes (Signed)
Established Patient Office Visit  Subjective    Patient ID: Brandy Padilla, female    DOB: [birth year]  Age: 42 y.o. MRN: [id number]  CC:  Chief Complaint  Patient presents with   Follow-up    Hospital follow up    HPI [facility] presents for complaint of persistent cough. She denies fever/chills or viral sx.   Outpatient Encounter Medications as of 01/19/2023  Medication Sig   cetirizine (ZYRTEC) 10 MG tablet    methylPREDNISolone (MEDROL DOSEPAK) 4 MG TBPK tablet Take po daily as directed   SPRINTEC 28 0.25-35 MG-MCG tablet TAKE 1 TABLET BY MOUTH DAILY   UNABLE TO FIND Med Name: OTC Cough medication.   benzonatate (TESSALON) 100 MG capsule Take 100 mg by mouth 3 (three) times daily. (Patient not taking: Reported on 01/19/2023)   No facility-administered encounter medications on file as of 01/19/2023.    History reviewed. No pertinent past medical history.  History reviewed. No pertinent surgical history.  Family History  Problem Relation Age of Onset   Alcohol abuse Father    Depression Father    Diabetes Maternal Grandmother    Breast cancer Paternal Grandmother    Cancer Paternal Grandmother     Social History   Socioeconomic History   Marital status: Married    Spouse name: Not on file   Number of children: Not on file   Years of education: Not on file   Highest education level: Bachelor's degree (e.g., BA, AB, BS)  Occupational History   Not on file  Tobacco Use   Smoking status: Never   Smokeless tobacco: Never  Vaping Use   Vaping status: Never Used  Substance and Sexual Activity   Alcohol use: Yes    Alcohol/week: 1.0 standard drink of alcohol    Types: 1 Cans of beer per week    Comment: Occassionally.   Drug use: Never   Sexual activity: Yes    Birth control/protection: Condom, Pill    Comment: Married. One partner.  Other Topics Concern   Not on file  Social History Narrative   Not on file   Social Determinants  of Health   Financial Resource Strain: Low Risk  (06/26/2022)   Overall Financial Resource Strain (CARDIA)    Difficulty of Paying Living Expenses: Not hard at all  Food Insecurity: No Food Insecurity (06/26/2022)   Hunger Vital Sign    Worried About Running Out of Food in the Last Year: Never true    Ran Out of Food in the Last Year: Never true  Transportation Needs: No Transportation Needs (06/26/2022)   PRAPARE - Administrator, Civil Service (Medical): No    Lack of Transportation (Non-Medical): No  Physical Activity: Insufficiently Active (06/26/2022)   Exercise Vital Sign    Days of Exercise per Week: 5 days    Minutes of Exercise per Session: 20 min  Stress: No Stress Concern Present (06/26/2022)   [employer] of Occupational Health - Occupational Stress Questionnaire    Feeling of Stress : Only a little  Social Connections: Socially Integrated (06/26/2022)   Social Connection and Isolation Panel [NHANES]    Frequency of Communication with Friends and Family: Three times a week    Frequency of Social Gatherings with Friends and Family: More than three times a week    Attends Religious Services: More than 4 times per year    Active Member of [employer] or Organizations: Yes    Attends Banker  Meetings: More than 4 times per year    Marital Status: Married  Catering manager Violence: Not on file    Review of Systems  All other systems reviewed and are negative.       Objective    BP 131/86 (BP Location: Right Arm, Patient Position: Sitting, Cuff Size: Normal)   Pulse 79   Temp 98.9 F (37.2 C) (Oral)   Resp 18   Ht 5\' 5"  (1.651 m)   Wt 156 lb 9.6 oz (71 kg)   LMP 12/27/2022 (Exact Date)   SpO2 98%   BMI 26.06 kg/m   Physical Exam Vitals and nursing note reviewed.  Constitutional:      General: She is not in acute distress. Cardiovascular:     Rate and Rhythm: Normal rate and regular rhythm.  Pulmonary:     Effort: Pulmonary effort is  normal.     Breath sounds: Normal breath sounds.  Abdominal:     Palpations: Abdomen is soft.     Tenderness: There is no abdominal tenderness.  Neurological:     General: No focal deficit present.     Mental Status: She is alert and oriented to person, place, and time.         Assessment & Plan:   Persistent cough -     Ambulatory referral to Pulmonology  Other orders -     methylPREDNISolone; Take po daily as directed  Dispense: 21 tablet; Refill: 0     No follow-ups on file.   Tommie Raymond, MD

## 2023-02-16 ENCOUNTER — Encounter: Payer: Self-pay | Admitting: Pulmonary Disease

## 2023-02-16 ENCOUNTER — Ambulatory Visit (INDEPENDENT_AMBULATORY_CARE_PROVIDER_SITE_OTHER): Payer: 59 | Admitting: Pulmonary Disease

## 2023-02-16 VITALS — BP 124/84 | HR 72 | Temp 98.3°F | Ht 65.0 in | Wt 157.4 lb

## 2023-02-16 DIAGNOSIS — J45991 Cough variant asthma: Secondary | ICD-10-CM

## 2023-02-16 LAB — CBC WITH DIFFERENTIAL/PLATELET
Basophils Absolute: 0.1 10*3/uL (ref 0.0–0.1)
Basophils Relative: 0.6 % (ref 0.0–3.0)
Eosinophils Absolute: 0.2 10*3/uL (ref 0.0–0.7)
Eosinophils Relative: 2.3 % (ref 0.0–5.0)
HCT: 42.1 % (ref 36.0–46.0)
Hemoglobin: 14.3 g/dL (ref 12.0–15.0)
Lymphocytes Relative: 22.9 % (ref 12.0–46.0)
Lymphs Abs: 2 10*3/uL (ref 0.7–4.0)
MCHC: 33.9 g/dL (ref 30.0–36.0)
MCV: 88.7 fL (ref 78.0–100.0)
Monocytes Absolute: 0.5 10*3/uL (ref 0.1–1.0)
Monocytes Relative: 6.1 % (ref 3.0–12.0)
Neutro Abs: 5.9 10*3/uL (ref 1.4–7.7)
Neutrophils Relative %: 68.1 % (ref 43.0–77.0)
Platelets: 302 10*3/uL (ref 150.0–400.0)
RBC: 4.74 Mil/uL (ref 3.87–5.11)
RDW: 13.3 % (ref 11.5–15.5)
WBC: 8.7 10*3/uL (ref 4.0–10.5)

## 2023-02-16 MED ORDER — ALBUTEROL SULFATE HFA 108 (90 BASE) MCG/ACT IN AERS: 2.0000 | INHALATION_SPRAY | Freq: Four times a day (QID) | RESPIRATORY_TRACT | 6 refills | Status: DC | PRN

## 2023-02-16 MED ORDER — FLUTICASONE-SALMETEROL 230-21 MCG/ACT IN AERO: 2.0000 | INHALATION_SPRAY | Freq: Two times a day (BID) | RESPIRATORY_TRACT | 12 refills | Status: DC

## 2023-02-16 NOTE — Patient Instructions (Signed)
Start advair inhaler 2 puffs twice daily - rinse mouth out after each use  Use albuterol inhaler 1-2 puffs every 4-6 hours as needed for cough, wheezing or shortness of breath  We will check CBC and IgE levels today  Follow up in 2 months for pulmonary function tests

## 2023-02-16 NOTE — Progress Notes (Signed)
Synopsis: Referred in December 2024 for chronic cough by Georganna Skeans, MD  Subjective:   PATIENT ID: Brandy Padilla GENDER: female DOB: 1980/07/16, MRN: [id number]  HPI  Chief Complaint  Patient presents with   Pulmonary Consult    Referred by Dr. Georganna Skeans. Pt c/o cough off and on for approx 15 years. She had PNA for the first time end of Oct 2024. Cough is usually prod with clear to yellow sputum. Cough tends to get worse at bedtime or when she feels tired. She has noticed laughing can bring on cough.    Brandy Padilla is a 42 year old woman, never smoker who is referred to pulmonary clinic for chronic cough.   She presents with a chronic cough that has been ongoing for over 15 years. The cough is triggered by irritations such as colds and worsens with such conditions, lasting significantly longer than the typical duration of a cold. The patient reports that the cough has been severe enough to cause blood in the mucus. The patient also experiences postnasal drip, which seems to exacerbate the cough.The cough is worse with cold weather, dry air, dust and wild fire smoke.  The patient has recently had pneumonia, which was treated with antibiotics. The patient was also given steroids, which seemed to provide significant relief from the cough. However, the cough returned as the dosage of steroids was reduced. The patient reports that this is the first year that the cough has been severe enough to disrupt sleep, which may have contributed to the development of pneumonia.  She denies any joint pains, muscle pains or skin rashes. She was seen by an allergist in [place] with grossly negative skin pric allergy testing, negative FENO and normal spirometry. Absolute eosinophil counts ranging 100-200 in the past.   A recent CT scan revealed a calcified spot on the patient's lung. The patient has not had any previous chest x-rays for comparison. The patient does not smoke or vape and has no  known allergies. The patient has not previously used an inhaler for the cough. 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   Family history - Father: fibromyalgia  No past medical history on file.   Family History  Problem Relation Age of Onset   Alcohol abuse Father    Depression Father    Diabetes Maternal Grandmother    Breast cancer Paternal Grandmother    Cancer Paternal Grandmother        breast CA     Social History   Socioeconomic History   Marital status: Married    Spouse name: Not on file   Number of children: Not on file   Years of education: Not on file   Highest education level: Bachelor's degree (e.g., BA, AB, BS)  Occupational History   Not on file  Tobacco Use   Smoking status: Never    Passive exposure: Never   Smokeless tobacco: Never  Vaping Use   Vaping status: Never Used  Substance and Sexual Activity   Alcohol use: Yes    Alcohol/week: 1.0 standard drink of alcohol    Types: 1 Cans of beer per week    Comment: Occassionally.   Drug use: Never   Sexual activity: Yes    Birth control/protection: Condom, Pill    Comment: Married. One partner.  Other Topics Concern   Not on file  Social History Narrative   Not on file   Social Determinants of Health   Financial  Resource Strain: Low Risk  (06/26/2022)   Overall Financial Resource Strain (CARDIA)    Difficulty of Paying Living Expenses: Not hard at all  Food Insecurity: No Food Insecurity (06/26/2022)   Hunger Vital Sign    Worried About Running Out of Food in the Last Year: Never true    Ran Out of Food in the Last Year: Never true  Transportation Needs: No Transportation Needs (06/26/2022)   PRAPARE - Administrator, Civil Service (Medical): No    Lack of Transportation (Non-Medical): No  Physical Activity: Insufficiently Active (06/26/2022)   Exercise Vital Sign    Days of Exercise per Week: 5 days    Minutes of Exercise per Session: 20 min  Stress: No Stress  Concern Present (06/26/2022)   [employer] of Occupational Health - Occupational Stress Questionnaire    Feeling of Stress : Only a little  Social Connections: Socially Integrated (06/26/2022)   Social Connection and Isolation Panel [NHANES]    Frequency of Communication with Friends and Family: Three times a week    Frequency of Social Gatherings with Friends and Family: More than three times a week    Attends Religious Services: More than 4 times per year    Active Member of Clubs or Organizations: Yes    Attends Engineer, structural: More than 4 times per year    Marital Status: Married  Catering manager Violence: Not on file     Allergies  Allergen Reactions   Sulfa Antibiotics Rash and Itching     Outpatient Medications Prior to Visit  Medication Sig Dispense Refill   cetirizine (ZYRTEC) 10 MG tablet Take 10 mg by mouth daily.     guaiFENesin (MUCINEX) 600 MG 12 hr tablet Take 600 mg by mouth 2 (two) times daily as needed.     guaifenesin (ROBITUSSIN) 100 MG/5ML syrup Take 200 mg by mouth 3 (three) times daily as needed for cough.     promethazine-dextromethorphan (PROMETHAZINE-DM) 6.25-15 MG/5ML syrup Take 1.25 mLs by mouth 4 (four) times daily as needed.     SPRINTEC 28 0.25-35 MG-MCG tablet TAKE 1 TABLET BY MOUTH DAILY 84 tablet 3   benzonatate (TESSALON) 100 MG capsule Take 100 mg by mouth 3 (three) times daily. (Patient not taking: Reported on 01/19/2023)     methylPREDNISolone (MEDROL DOSEPAK) 4 MG TBPK tablet Take po daily as directed 21 tablet 0   UNABLE TO FIND Med Name: OTC Cough medication.     No facility-administered medications prior to visit.    Review of Systems  Constitutional:  Negative for chills, fever, malaise/fatigue and weight loss.  HENT:  Positive for congestion and sore throat. Negative for sinus pain.   Eyes: Negative.   Respiratory:  Positive for cough. Negative for hemoptysis, sputum production, shortness of breath and wheezing.    Cardiovascular:  Negative for chest pain, palpitations, orthopnea, claudication and leg swelling.  Gastrointestinal:  Negative for abdominal pain, heartburn, nausea and vomiting.  Genitourinary: Negative.   Musculoskeletal:  Negative for joint pain and myalgias.  Skin:  Negative for rash.  Neurological:  Positive for headaches. Negative for weakness.  Endo/Heme/Allergies: Negative.   Psychiatric/Behavioral: Negative.     Objective:   Vitals:   02/16/23 1036  BP: 124/84  Pulse: 72  Temp: 98.3 F (36.8 C)  TempSrc: Oral  SpO2: 100%  Weight: 157 lb 6.4 oz (71.4 kg)  Height: 5\' 5"  (1.651 m)   Physical Exam Constitutional:      General: She  is not in acute distress.    Appearance: Normal appearance.  Eyes:     General: No scleral icterus.    Conjunctiva/sclera: Conjunctivae normal.  Cardiovascular:     Rate and Rhythm: Normal rate and regular rhythm.  Pulmonary:     Breath sounds: Rhonchi (right anterior) present. No wheezing or rales.  Musculoskeletal:     Right lower leg: No edema.     Left lower leg: No edema.  Skin:    General: Skin is warm and dry.  Neurological:     General: No focal deficit present.     CBC    Component Value Date/Time   WBC 9.3 01/06/2023 1831   RBC 4.52 01/06/2023 1831   HGB 13.2 01/06/2023 1831   HGB 15.6 06/30/2022 0926   HCT 39.8 01/06/2023 1831   HCT 47.5 (H) 06/30/2022 0926   PLT 181 01/06/2023 1831   PLT 266 06/30/2022 0926   MCV 88.1 01/06/2023 1831   MCV 90 06/30/2022 0926   MCH 29.2 01/06/2023 1831   MCHC 33.2 01/06/2023 1831   RDW 12.3 01/06/2023 1831   RDW 12.4 06/30/2022 0926   LYMPHSABS 1.1 01/06/2023 1831   LYMPHSABS 2.1 06/30/2022 0926   MONOABS 1.0 01/06/2023 1831   EOSABS 0.1 01/06/2023 1831   EOSABS 0.2 06/30/2022 0926   BASOSABS 0.0 01/06/2023 1831   BASOSABS 0.1 06/30/2022 0926      Latest Ref Rng & Units 01/06/2023    6:31 PM 06/30/2022    9:26 AM 06/24/2021   11:04 AM  BMP  Glucose 70 - 99 mg/dL 109   85  82   BUN 6 - 20 mg/dL 9  9  10    Creatinine 0.44 - 1.00 mg/dL 6.04  5.40  9.81   BUN/Creat Ratio 9 - 23  10  13    Sodium 135 - 145 mmol/L 135  141  141   Potassium 3.5 - 5.1 mmol/L 3.8  4.1  4.6   Chloride 98 - 111 mmol/L 105  103  104   CO2 22 - 32 mmol/L 21  19  23    Calcium 8.9 - 10.3 mg/dL 8.7  9.5  9.4    Chest imaging: CT Chest 01/06/23 Cardiovascular: Nonaneurysmal aorta. Normal cardiac size. No pericardial effusion   Mediastinum/Nodes: Midline trachea. No thyroid mass. No suspicious lymph nodes. Esophagus within normal limits   Lungs/Pleura: No pleural effusion or pneumothorax. Heterogeneous patchy consolidation in the left upper lobe consistent with pneumonia. 1.7 x 1.4 cm largely ossified nodule in the right upper lobe corresponding to radiographic abnormality.  PFT:     No data to display          Labs:  Path:  Echo:  Heart Catheterization:     Assessment & Plan:   Asthma, cough variant - Plan: fluticasone-salmeterol (ADVAIR HFA) 230-21 MCG/ACT inhaler, albuterol (VENTOLIN HFA) 108 (90 Base) MCG/ACT inhaler, IgE, CBC with Differential/Platelet, Pulmonary Function Test, CBC with Differential/Platelet, IgE  Discussion: Brandy Padilla is a 42 year old woman, never smoker who is referred to pulmonary clinic for chronic cough.   Chronic Cough Longstanding history of cough, exacerbated by colds and lasting longer than typical. Recent episode of pneumonia. Cough is disruptive to sleep. Noted to have wheezing during severe episodes. Steroid responsive. No significant findings on allergy testing.  -Start Advair inhaler (steroid and long-acting albuterol), 2 puffs in the morning and 2 puffs in the evening. Rinse mouth after use to prevent thrush. -As needed, use albuterol inhaler every  4-6 hours, especially for wheezing or if cough is disrupting sleep. -Check eosinophil levels and IgE level to assess for asthma inflammation. -Consider repeating CT chest in the  future to monitor calcified spot. -Consider adding a third inhaled medicine or a biologic medicine if cough does not completely resolve with Advair.  Calcified Spot on Lung Identified on recent CT chest. Likely chronic. -Monitor with future CT scan in October 2025  Follow-up in 2 months with breathing tests.  Brandy Comas, MD [facility] Office: [phone]   Current Outpatient Medications:    albuterol (VENTOLIN HFA) 108 (90 Base) MCG/ACT inhaler, Inhale 2 puffs into the lungs every 6 (six) hours as needed for wheezing or shortness of breath., Disp: 8.5 g, Rfl: 6   cetirizine (ZYRTEC) 10 MG tablet, Take 10 mg by mouth daily., Disp: , Rfl:    fluticasone-salmeterol (ADVAIR HFA) 230-21 MCG/ACT inhaler, Inhale 2 puffs into the lungs 2 (two) times daily., Disp: 1 each, Rfl: 12   guaiFENesin (MUCINEX) 600 MG 12 hr tablet, Take 600 mg by mouth 2 (two) times daily as needed., Disp: , Rfl:    guaifenesin (ROBITUSSIN) 100 MG/5ML syrup, Take 200 mg by mouth 3 (three) times daily as needed for cough., Disp: , Rfl:    promethazine-dextromethorphan (PROMETHAZINE-DM) 6.25-15 MG/5ML syrup, Take 1.25 mLs by mouth 4 (four) times daily as needed., Disp: , Rfl:    SPRINTEC 28 0.25-35 MG-MCG tablet, TAKE 1 TABLET BY MOUTH DAILY, Disp: 84 tablet, Rfl: 3

## 2023-02-17 ENCOUNTER — Encounter: Payer: Self-pay | Admitting: Pulmonary Disease

## 2023-02-17 ENCOUNTER — Telehealth: Payer: Self-pay | Admitting: Pulmonary Disease

## 2023-02-17 DIAGNOSIS — J45991 Cough variant asthma: Secondary | ICD-10-CM

## 2023-02-17 LAB — IGE: IgE (Immunoglobulin E), Serum: 4 kU/L (ref <=114)

## 2023-02-17 NOTE — Telephone Encounter (Signed)
Pt calling in bc her pharmacy is having trouble get RX for fluticasone-salmeterol (ADVAIR HFA) 230-21 MCG/ACT inhaler   [id number] - [place], [place] - [address] Or [facility] [address]

## 2023-02-19 NOTE — Telephone Encounter (Signed)
Addressed in [place] encounter on 02/19/23. Closing this encounter.

## 2023-03-14 ENCOUNTER — Telehealth: Payer: Self-pay | Admitting: Pulmonary Disease

## 2023-03-14 DIAGNOSIS — J45991 Cough variant asthma: Secondary | ICD-10-CM

## 2023-03-14 NOTE — Telephone Encounter (Signed)
 Need PFT order put in per Dr.'s last AVS. TY

## 2023-03-19 MED ORDER — FLUTICASONE-SALMETEROL 230-21 MCG/ACT IN AERO: 2.0000 | INHALATION_SPRAY | Freq: Two times a day (BID) | RESPIRATORY_TRACT | 3 refills | Status: DC

## 2023-03-19 NOTE — Telephone Encounter (Signed)
 Pft has been ordered.

## 2023-03-31 ENCOUNTER — Institutional Professional Consult (permissible substitution): Payer: 59 | Admitting: Pulmonary Disease

## 2023-05-24 ENCOUNTER — Ambulatory Visit (HOSPITAL_BASED_OUTPATIENT_CLINIC_OR_DEPARTMENT_OTHER): Admitting: Family Medicine

## 2023-05-24 ENCOUNTER — Encounter (HOSPITAL_BASED_OUTPATIENT_CLINIC_OR_DEPARTMENT_OTHER): Payer: Self-pay

## 2023-05-24 ENCOUNTER — Ambulatory Visit
Admission: EM | Admit: 2023-05-24 | Discharge: 2023-05-24 | Disposition: A | Discharge status: Home / Self Care | Attending: Internal Medicine | Admitting: Internal Medicine

## 2023-05-24 ENCOUNTER — Other Ambulatory Visit: Payer: Self-pay

## 2023-05-24 ENCOUNTER — Telehealth: Payer: Self-pay | Admitting: Internal Medicine

## 2023-05-24 ENCOUNTER — Encounter: Payer: Self-pay | Admitting: Emergency Medicine

## 2023-05-24 ENCOUNTER — Ambulatory Visit (INDEPENDENT_AMBULATORY_CARE_PROVIDER_SITE_OTHER)

## 2023-05-24 ENCOUNTER — Encounter: Payer: Self-pay | Admitting: Family Medicine

## 2023-05-24 DIAGNOSIS — J180 Bronchopneumonia, unspecified organism: Secondary | ICD-10-CM

## 2023-05-24 DIAGNOSIS — R053 Chronic cough: Secondary | ICD-10-CM | POA: Diagnosis not present

## 2023-05-24 MED ORDER — BENZONATATE 100 MG PO CAPS: 100.0000 mg | ORAL_CAPSULE | Freq: Three times a day (TID) | ORAL | 0 refills | Status: AC | PRN

## 2023-05-24 MED ORDER — AMOXICILLIN 500 MG PO CAPS: 1000.0000 mg | ORAL_CAPSULE | Freq: Three times a day (TID) | ORAL | 0 refills | Status: AC

## 2023-05-24 MED ORDER — AZITHROMYCIN 250 MG PO TABS: 500.0000 mg | ORAL_TABLET | Freq: Once | ORAL | 0 refills | Status: AC

## 2023-05-24 MED ORDER — AZITHROMYCIN 250 MG PO TABS: ORAL_TABLET | ORAL | 0 refills | Status: AC

## 2023-05-24 NOTE — Telephone Encounter (Signed)
 Called patient to discuss x-ray results showing bronchitis versus bronchopneumonia.  Patient was prescribed azithromycin on discharge.  Will add amoxicillin especially given patient's history of pneumonia and chronic lung issues.  Patient reported that she took the first 500 mg azithromycin dose and vomited approximately 15 minutes after.  Therefore, will send this dose again for her to restart tomorrow and then complete the remainder of this the following day.  She verbalized understanding of this.  Advised to have repeat x-ray in approximately 3 to 4 weeks with PCP.  Patient states she also has repeat CT scan with pulmonologist in May.  All questions answered for patient.  She verbalized understanding.

## 2023-05-24 NOTE — ED Provider Notes (Signed)
 [facility]    CSN: [id number] Arrival date & time: 05/24/23  0848      History   Chief Complaint Chief Complaint  Patient presents with   Cough    HPI Brandy Padilla is a 43 y.o. female.   Patient presents with cough and nasal congestion that started about 10 days ago.  Reports cough has become more severe and persistent.  Cough is productive with blood-tinged sputum.  Patient reports history of pneumonia in October 2024.  She also had suspected pulmonary nodule at that time.  Although, she reports that she followed up with pulmonology and they were concerned that it was an encapsulated fungal infection.  She is to return in a few months to have repeat imaging of the chest to see if this is cleared.  She denies any known sick contacts or fevers recently.  Has been taking over-the-counter cough and cold medication with minimal improvement in symptoms.  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   Cough   History reviewed. No pertinent past medical history.  Patient Active Problem List   Diagnosis Date Noted   Preventative health care 06/24/2020   Seasonal allergic rhinitis 08/27/2019    History reviewed. No pertinent surgical history.  OB History   No obstetric history on file.      Home Medications    Prior to Admission medications   Medication Sig Start Date End Date Taking? Authorizing Provider  azithromycin (ZITHROMAX Z-PAK) 250 MG tablet Take 2 tablets (500 mg total) by mouth daily for 1 day, THEN 1 tablet (250 mg total) daily for 4 days. 05/24/23 05/29/23 Yes Deirdra Heumann, Acie Fredrickson, FNP  benzonatate (TESSALON) 100 MG capsule Take 1 capsule (100 mg total) by mouth every 8 (eight) hours as needed for cough. 05/24/23  Yes Omarie Parcell, Acie Fredrickson, FNP  albuterol (VENTOLIN HFA) 108 (90 Base) MCG/ACT inhaler Inhale 2 puffs into the lungs every 6 (six) hours as needed for wheezing or shortness of  breath. 02/16/23   Martina Sinner, MD  amoxicillin (AMOXIL) 500 MG capsule Take 2 capsules (1,000 mg total) by mouth 3 (three) times daily for 5 days. 05/24/23 05/29/23  Gustavus Bryant, FNP  azithromycin (ZITHROMAX Z-PAK) 250 MG tablet Take 2 tablets (500 mg total) by mouth once for 1 dose. Take this 500 mg dose tomorrow. Then, complete the previous azithromycin prescription the following day with 250 mg daily. 05/24/23 05/24/23  Gustavus Bryant, FNP  cetirizine (ZYRTEC) 10 MG tablet Take 10 mg by mouth daily.    [provider]  fluticasone-salmeterol (ADVAIR HFA) 230-21 MCG/ACT inhaler Inhale 2 puffs into the lungs 2 (two) times daily. 03/19/23   Martina Sinner, MD  guaiFENesin (MUCINEX) 600 MG 12 hr tablet Take 600 mg by mouth 2 (two) times daily as needed.    [provider]  guaifenesin (ROBITUSSIN) 100 MG/5ML syrup Take 200 mg by mouth 3 (three) times daily as needed for cough.    [provider]  promethazine-dextromethorphan (PROMETHAZINE-DM) 6.25-15 MG/5ML syrup Take 1.25 mLs by mouth 4 (four) times daily as needed. 01/30/23   [provider]  SPRINTEC 28 0.25-35 MG-MCG tablet TAKE 1 TABLET BY MOUTH DAILY 09/22/22   Georganna Skeans, MD    Family History Family History  Problem Relation Age of Onset   Alcohol abuse Father    Depression Father    Diabetes Maternal Grandmother    Breast cancer  Paternal Grandmother    Cancer Paternal Grandmother        breast CA    Social History Social History   Tobacco Use   Smoking status: Never    Passive exposure: Never   Smokeless tobacco: Never  Vaping Use   Vaping status: Never Used  Substance Use Topics   Alcohol use: Yes    Alcohol/week: 1.0 standard drink of alcohol    Types: 1 Cans of beer per week    Comment: Occassionally.   Drug use: Never     Allergies   Sulfa antibiotics   Review of Systems Review of Systems Per HPI  Physical Exam Triage Vital Signs ED Triage Vitals [05/24/23  1126]  Encounter Vitals Group     BP (!) 157/98     Systolic BP Percentile      Diastolic BP Percentile      Pulse Rate 99     Resp 18     Temp 98.2 F (36.8 C)     Temp Source Oral     SpO2 96 %     Weight      Height      Head Circumference      Peak Flow      Pain Score 0     Pain Loc      Pain Education      Exclude from Growth Chart    No data found.  Updated Vital Signs BP (!) 157/98 (BP Location: Right Arm)   Pulse 99   Temp 98.2 F (36.8 C) (Oral)   Resp 18   SpO2 96%   Visual Acuity Right Eye Distance:   Left Eye Distance:   Bilateral Distance:    Right Eye Near:   Left Eye Near:    Bilateral Near:     Physical Exam Constitutional:      General: She is not in acute distress.    Appearance: Normal appearance. She is not toxic-appearing or diaphoretic.  HENT:     Head: Normocephalic and atraumatic.     Right Ear: Tympanic membrane and ear canal normal.     Left Ear: Tympanic membrane and ear canal normal.     Nose: Congestion present.     Mouth/Throat:     Mouth: Mucous membranes are moist.     Pharynx: Posterior oropharyngeal erythema present.  Eyes:     Extraocular Movements: Extraocular movements intact.     Conjunctiva/sclera: Conjunctivae normal.     Pupils: Pupils are equal, round, and reactive to light.  Cardiovascular:     Rate and Rhythm: Normal rate and regular rhythm.     Pulses: Normal pulses.     Heart sounds: Normal heart sounds.  Pulmonary:     Effort: Pulmonary effort is normal. No respiratory distress.     Breath sounds: Normal breath sounds. No stridor. No wheezing, rhonchi or rales.     Comments: Harsh coughing noted. Musculoskeletal:        General: Normal range of motion.     Cervical back: Normal range of motion.  Skin:    General: Skin is warm and dry.  Neurological:     General: No focal deficit present.     Mental Status: She is alert and oriented to person, place, and time. Mental status is at baseline.   Psychiatric:        Mood and Affect: Mood normal.        Behavior: Behavior normal.      UC  Treatments / Results  Labs (all labs ordered are listed, but only abnormal results are displayed) Labs Reviewed - No data to display  EKG   Radiology DG Chest 2 View Result Date: 05/24/2023 CLINICAL DATA:  Cough and congestion for 10 days. EXAM: CHEST - 2 VIEW COMPARISON:  Chest x-ray and chest CT 01/06/2023 FINDINGS: The cardiac silhouette, mediastinal and hilar contours are within normal limits and stable. Mild peribronchial thickening and slight increased interstitial markings in the right middle lobe could suggest focal bronchitis or early or developing bronchopneumonia. The left lung is clear. No pleural effusions. 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 IMPRESSION: Mild peribronchial thickening and slight increased interstitial markings in the right middle lobe could suggest focal bronchitis or early or developing bronchopneumonia. Electronically Signed   By: Rudie Meyer M.D.   On: 05/24/2023 14:21    Procedures Procedures (including critical care time)  Medications Ordered in UC Medications - No data to display  Initial Impression / Assessment and Plan / UC Course  I have reviewed the triage vital signs and the nursing notes.  Pertinent labs & imaging results that were available during my care of the patient were reviewed by me and considered in my medical decision making (see chart for details).     Chest x-ray concerning for bronchitis versus bronchopneumonia.  Patient discharged on azithromycin.  Will add amoxicillin.  See telephone note.  Patient to continue albuterol.  Cough medication prescribed with patient.  Advised to return in 3 to 4 weeks with PCP or urgent care to have repeat chest x-ray.  Advised strict return and ER precautions.  Patient verbalized understanding and was agreeable with plan.  Called patient and discussed x-ray results. Final Clinical  Impressions(s) / UC Diagnoses   Final diagnoses:  Persistent cough     Discharge Instructions      I will call you if x-ray result is abnormal.  I have prescribed you an antibiotic and a cough medication.  Please follow-up if any symptoms persist or worsen.    ED Prescriptions     Medication Sig Dispense Auth. Provider   azithromycin (ZITHROMAX Z-PAK) 250 MG tablet Take 2 tablets (500 mg total) by mouth daily for 1 day, THEN 1 tablet (250 mg total) daily for 4 days. 6 tablet [place], [place], [place]   benzonatate (TESSALON) 100 MG capsule Take 1 capsule (100 mg total) by mouth every 8 (eight) hours as needed for cough. 21 capsule [place], Acie Fredrickson, [place]      PDMP not reviewed this encounter.   Gustavus Bryant, [place] 05/24/23 1520

## 2023-05-24 NOTE — Discharge Instructions (Signed)
 I will call you if x-ray result is abnormal.  I have prescribed you an antibiotic and a cough medication.  Please follow-up if any symptoms persist or worsen.

## 2023-05-24 NOTE — ED Triage Notes (Signed)
 Pt here for cough and congestion x 10 days; pt sts some drainage from left eye yesterday

## 2023-05-26 ENCOUNTER — Ambulatory Visit: Payer: 59 | Admitting: Pulmonary Disease

## 2023-05-26 ENCOUNTER — Ambulatory Visit: Payer: 59

## 2023-06-02 ENCOUNTER — Encounter: Payer: Self-pay | Admitting: Family Medicine

## 2023-06-21 ENCOUNTER — Other Ambulatory Visit: Payer: Self-pay | Admitting: Family Medicine

## 2023-06-21 DIAGNOSIS — Z1231 Encounter for screening mammogram for malignant neoplasm of breast: Secondary | ICD-10-CM

## 2023-07-12 ENCOUNTER — Ambulatory Visit
Admission: RE | Admit: 2023-07-12 | Discharge: 2023-07-12 | Disposition: A | Discharge status: Home / Self Care | Source: Ambulatory Visit | Attending: Family Medicine | Admitting: Family Medicine

## 2023-07-12 DIAGNOSIS — Z1231 Encounter for screening mammogram for malignant neoplasm of breast: Secondary | ICD-10-CM

## 2023-07-18 ENCOUNTER — Other Ambulatory Visit: Payer: Self-pay | Admitting: Family Medicine

## 2023-08-02 ENCOUNTER — Encounter: Payer: Self-pay | Admitting: Family Medicine

## 2023-08-02 ENCOUNTER — Other Ambulatory Visit: Payer: Self-pay | Admitting: Family Medicine

## 2023-08-02 ENCOUNTER — Ambulatory Visit (INDEPENDENT_AMBULATORY_CARE_PROVIDER_SITE_OTHER): Admitting: Family Medicine

## 2023-08-02 VITALS — BP 123/81 | HR 80 | Temp 97.8°F | Resp 16 | Ht 65.0 in | Wt 154.0 lb

## 2023-08-02 DIAGNOSIS — Z130 Encounter for screening for diseases of the blood and blood-forming organs and certain disorders involving the immune mechanism: Secondary | ICD-10-CM

## 2023-08-02 DIAGNOSIS — Z13220 Encounter for screening for lipoid disorders: Secondary | ICD-10-CM | POA: Diagnosis not present

## 2023-08-02 DIAGNOSIS — Z0000 Encounter for general adult medical examination without abnormal findings: Secondary | ICD-10-CM | POA: Diagnosis not present

## 2023-08-02 DIAGNOSIS — Z1329 Encounter for screening for other suspected endocrine disorder: Secondary | ICD-10-CM

## 2023-08-02 DIAGNOSIS — Z13228 Encounter for screening for other metabolic disorders: Secondary | ICD-10-CM

## 2023-08-02 MED ORDER — NORGESTIMATE-ETH ESTRADIOL 0.25-35 MG-MCG PO TABS: 1.0000 | ORAL_TABLET | Freq: Every day | ORAL | 3 refills | Status: AC

## 2023-08-02 MED ORDER — NORGESTIMATE-ETH ESTRADIOL 0.25-35 MG-MCG PO TABS: 1.0000 | ORAL_TABLET | Freq: Every day | ORAL | 3 refills | Status: DC

## 2023-08-02 NOTE — Progress Notes (Unsigned)
 Established Patient Office Visit  Subjective    Patient ID: Brandy Padilla, female    DOB: 08-Jul-1980  Age: 43 y.o. MRN: [id number]  CC:  Chief Complaint  Patient presents with   Annual Exam    HPI Brandy Padilla presents for a routine annual exam. Patient denies acute complaints or concerns.   Outpatient Encounter Medications as of 08/02/2023  Medication Sig   benzonatate  (TESSALON ) 100 MG capsule Take 1 capsule (100 mg total) by mouth every 8 (eight) hours as needed for cough.   cetirizine (ZYRTEC) 10 MG tablet Take 10 mg by mouth daily.   guaiFENesin (MUCINEX) 600 MG 12 hr tablet Take 600 mg by mouth 2 (two) times daily as needed.   [DISCONTINUED] albuterol  (VENTOLIN  HFA) 108 (90 Base) MCG/ACT inhaler Inhale 2 puffs into the lungs every 6 (six) hours as needed for wheezing or shortness of breath.   [DISCONTINUED] fluticasone -salmeterol (ADVAIR HFA) 230-21 MCG/ACT inhaler Inhale 2 puffs into the lungs 2 (two) times daily.   [DISCONTINUED] norgestimate -ethinyl estradiol  (MONO-LINYAH ) 0.25-35 MG-MCG tablet TAKE 1 TABLET BY MOUTH DAILY   [DISCONTINUED] guaifenesin (ROBITUSSIN) 100 MG/5ML syrup Take 200 mg by mouth 3 (three) times daily as needed for cough.   [DISCONTINUED] norgestimate -ethinyl estradiol  (MONO-LINYAH ) 0.25-35 MG-MCG tablet Take 1 tablet by mouth daily.   [DISCONTINUED] promethazine-dextromethorphan (PROMETHAZINE-DM) 6.25-15 MG/5ML syrup Take 1.25 mLs by mouth 4 (four) times daily as needed.   No facility-administered encounter medications on file as of 08/02/2023.    No past medical history on file.  No past surgical history on file.  Family History  Problem Relation Age of Onset   Alcohol abuse Father    Depression Father    Diabetes Maternal Grandmother    Breast cancer Paternal Grandmother    Cancer Paternal Grandmother        breast CA    Social History   Socioeconomic History   Marital status: Married    Spouse name: Not on file    Number of children: Not on file   Years of education: Not on file   Highest education level: Bachelor's degree (e.g., BA, AB, BS)  Occupational History   Not on file  Tobacco Use   Smoking status: Never    Passive exposure: Never   Smokeless tobacco: Never  Vaping Use   Vaping status: Never Used  Substance and Sexual Activity   Alcohol use: Yes    Alcohol/week: 1.0 standard drink of alcohol    Types: 1 Cans of beer per week    Comment: Occassionally.   Drug use: Never   Sexual activity: Yes    Birth control/protection: Condom, Pill    Comment: Married. One partner.  Other Topics Concern   Not on file  Social History Narrative   Not on file   Social Drivers of Health   Financial Resource Strain: Low Risk  (05/23/2023)   Overall Financial Resource Strain (CARDIA)    Difficulty of Paying Living Expenses: Not very hard  Food Insecurity: No Food Insecurity (05/23/2023)   Hunger Vital Sign    Worried About Running Out of Food in the Last Year: Never true    Ran Out of Food in the Last Year: Never true  Transportation Needs: No Transportation Needs (05/23/2023)   PRAPARE - Administrator, Civil Service (Medical): No    Lack of Transportation (Non-Medical): No  Physical Activity: Insufficiently Active (05/23/2023)   Exercise Vital Sign    Days of Exercise per Week:  2 days    Minutes of Exercise per Session: 20 min  Stress: No Stress Concern Present (05/23/2023)   [employer] of Occupational Health - Occupational Stress Questionnaire    Feeling of Stress : Only a little  Social Connections: Socially Integrated (05/23/2023)   Social Connection and Isolation Panel [NHANES]    Frequency of Communication with Friends and Family: Twice a week    Frequency of Social Gatherings with Friends and Family: Twice a week    Attends Religious Services: More than 4 times per year    Active Member of [employer] or Organizations: Yes    Attends Engineer, structural: More  than 4 times per year    Marital Status: Married  Catering manager Violence: Not on file    Review of Systems  All other systems reviewed and are negative.       Objective    BP 123/81   Pulse 80   Temp 97.8 F (36.6 C) (Oral)   Resp 16   Ht 5\' 5"  (1.651 m)   Wt 154 lb (69.9 kg)   SpO2 98%   BMI 25.63 kg/m   Physical Exam Vitals and nursing note reviewed.  Constitutional:      General: She is not in acute distress. HENT:     Head: Normocephalic and atraumatic.     Right Ear: Tympanic membrane, ear canal and external ear normal.     Left Ear: Tympanic membrane, ear canal and external ear normal.     Nose: Nose normal.     Mouth/Throat:     Mouth: Mucous membranes are moist.     Pharynx: Oropharynx is clear.  Eyes:     Conjunctiva/sclera: Conjunctivae normal.     Pupils: Pupils are equal, round, and reactive to light.  Neck:     Thyroid: No thyromegaly.  Cardiovascular:     Rate and Rhythm: Normal rate and regular rhythm.     Heart sounds: Normal heart sounds. No murmur heard. Pulmonary:     Effort: Pulmonary effort is normal. No respiratory distress.     Breath sounds: Normal breath sounds.  Abdominal:     General: There is no distension.     Palpations: Abdomen is soft. There is no mass.     Tenderness: There is no abdominal tenderness.  Musculoskeletal:        General: Normal range of motion.     Cervical back: Normal range of motion and neck supple.  Skin:    General: Skin is warm and dry.  Neurological:     General: No focal deficit present.     Mental Status: She is alert and oriented to person, place, and time.  Psychiatric:        Mood and Affect: Mood normal.        Behavior: Behavior normal.         Assessment & Plan:   Annual physical exam -     CMP14+EGFR  Screening for deficiency anemia -     CBC with Differential/Platelet  Screening for lipid disorders -     Lipid panel  Screening for endocrine/metabolic/immunity disorders -      Hemoglobin A1c     No follow-ups on file.   Arlo Lama, MD

## 2023-08-02 NOTE — Progress Notes (Unsigned)
 -  Patient is here to have annually  complete physical examination  -[facility] gap address -labs taken

## 2023-08-03 ENCOUNTER — Ambulatory Visit: Payer: 59 | Admitting: Pulmonary Disease

## 2023-08-03 ENCOUNTER — Ambulatory Visit (INDEPENDENT_AMBULATORY_CARE_PROVIDER_SITE_OTHER): Payer: 59 | Admitting: Pulmonary Disease

## 2023-08-03 ENCOUNTER — Encounter: Payer: Self-pay | Admitting: Pulmonary Disease

## 2023-08-03 ENCOUNTER — Encounter: Payer: Self-pay | Admitting: Family Medicine

## 2023-08-03 VITALS — BP 120/84 | HR 110 | Ht 65.0 in | Wt 156.0 lb

## 2023-08-03 DIAGNOSIS — R911 Solitary pulmonary nodule: Secondary | ICD-10-CM | POA: Diagnosis not present

## 2023-08-03 DIAGNOSIS — J45991 Cough variant asthma: Secondary | ICD-10-CM

## 2023-08-03 LAB — CBC WITH DIFFERENTIAL/PLATELET
Basophils Absolute: 0.1 10*3/uL (ref 0.0–0.2)
Basos: 1 %
EOS (ABSOLUTE): 0.2 10*3/uL (ref 0.0–0.4)
Eos: 2 %
Hematocrit: 46.9 % — ABNORMAL HIGH (ref 34.0–46.6)
Hemoglobin: 15 g/dL (ref 11.1–15.9)
Immature Grans (Abs): 0 10*3/uL (ref 0.0–0.1)
Immature Granulocytes: 0 %
Lymphocytes Absolute: 2 10*3/uL (ref 0.7–3.1)
Lymphs: 26 %
MCH: 29.5 pg (ref 26.6–33.0)
MCHC: 32 g/dL (ref 31.5–35.7)
MCV: 92 fL (ref 79–97)
Monocytes Absolute: 0.4 10*3/uL (ref 0.1–0.9)
Monocytes: 6 %
Neutrophils Absolute: 5 10*3/uL (ref 1.4–7.0)
Neutrophils: 65 %
Platelets: 256 10*3/uL (ref 150–450)
RBC: 5.09 x10E6/uL (ref 3.77–5.28)
RDW: 13 % (ref 11.7–15.4)
WBC: 7.7 10*3/uL (ref 3.4–10.8)

## 2023-08-03 LAB — PULMONARY FUNCTION TEST
DL/VA % pred: 125 %
DL/VA: 5.49 ml/min/mmHg/L
DLCO cor % pred: 112 %
DLCO cor: 25.22 ml/min/mmHg
DLCO unc % pred: 117 %
DLCO unc: 26.38 ml/min/mmHg
FEF 25-75 Post: 3.76 L/s
FEF 25-75 Pre: 3.2 L/s
FEF2575-%Change-Post: 17 %
FEF2575-%Pred-Post: 122 %
FEF2575-%Pred-Pre: 103 %
FEV1-%Change-Post: 4 %
FEV1-%Pred-Post: 103 %
FEV1-%Pred-Pre: 98 %
FEV1-Post: 3.17 L
FEV1-Pre: 3.02 L
FEV1FVC-%Change-Post: 0 %
FEV1FVC-%Pred-Pre: 101 %
FEV6-%Change-Post: 5 %
FEV6-%Pred-Post: 103 %
FEV6-%Pred-Pre: 98 %
FEV6-Post: 3.84 L
FEV6-Pre: 3.64 L
FEV6FVC-%Pred-Post: 102 %
FEV6FVC-%Pred-Pre: 102 %
FVC-%Change-Post: 5 %
FVC-%Pred-Post: 101 %
FVC-%Pred-Pre: 96 %
FVC-Post: 3.84 L
FVC-Pre: 3.64 L
Post FEV1/FVC ratio: 83 %
Post FEV6/FVC ratio: 100 %
Pre FEV1/FVC ratio: 83 %
Pre FEV6/FVC Ratio: 100 %
RV % pred: 102 %
RV: 1.74 L
TLC % pred: 102 %
TLC: 5.31 L

## 2023-08-03 LAB — CMP14+EGFR
ALT: 20 IU/L (ref 0–32)
AST: 20 IU/L (ref 0–40)
Albumin: 4.9 g/dL (ref 3.9–4.9)
Alkaline Phosphatase: 56 IU/L (ref 44–121)
BUN/Creatinine Ratio: 11 (ref 9–23)
BUN: 10 mg/dL (ref 6–24)
Bilirubin Total: 0.4 mg/dL (ref 0.0–1.2)
CO2: 19 mmol/L — ABNORMAL LOW (ref 20–29)
Calcium: 9.2 mg/dL (ref 8.7–10.2)
Chloride: 103 mmol/L (ref 96–106)
Creatinine, Ser: 0.91 mg/dL (ref 0.57–1.00)
Globulin, Total: 2.3 g/dL (ref 1.5–4.5)
Glucose: 76 mg/dL (ref 70–99)
Potassium: 4 mmol/L (ref 3.5–5.2)
Sodium: 140 mmol/L (ref 134–144)
Total Protein: 7.2 g/dL (ref 6.0–8.5)
eGFR: 80 mL/min/{1.73_m2} (ref >59)

## 2023-08-03 LAB — HEMOGLOBIN A1C
Est. average glucose Bld gHb Est-mCnc: 97 mg/dL
Hgb A1c MFr Bld: 5 % (ref 4.8–5.6)

## 2023-08-03 LAB — LIPID PANEL
Chol/HDL Ratio: 3 ratio (ref 0.0–4.4)
Cholesterol, Total: 222 mg/dL — ABNORMAL HIGH (ref 100–199)
HDL: 75 mg/dL (ref >39)
LDL Chol Calc (NIH): 127 mg/dL — ABNORMAL HIGH (ref 0–99)
Triglycerides: 117 mg/dL (ref 0–149)
VLDL Cholesterol Cal: 20 mg/dL (ref 5–40)

## 2023-08-03 MED ORDER — ALBUTEROL SULFATE HFA 108 (90 BASE) MCG/ACT IN AERS: 1.0000 | INHALATION_SPRAY | Freq: Four times a day (QID) | RESPIRATORY_TRACT | 6 refills | Status: AC | PRN

## 2023-08-03 MED ORDER — FLUTICASONE-SALMETEROL 115-21 MCG/ACT IN AERO: 2.0000 | INHALATION_SPRAY | Freq: Two times a day (BID) | RESPIRATORY_TRACT | 3 refills | Status: AC

## 2023-08-03 NOTE — Patient Instructions (Addendum)
 We will reduce your advair dose to 115-4.5mcg 2 puffs twice daily - rinse mouth out after each use  Use albuterol  inhaler 1-2 puffs every 4-6 hours  as needed  Your breathing tests are normal  We will repeat CT Chest in October  Follow up in 6 months

## 2023-08-03 NOTE — Progress Notes (Signed)
 Full PFT performed today.

## 2023-08-03 NOTE — Patient Instructions (Signed)
 Full PFT performed today.

## 2023-08-03 NOTE — Progress Notes (Signed)
 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   Initial OV 02/16/23 She presents with a chronic cough that has been ongoing for over 15 years. The cough is triggered by irritations such as colds and worsens with such conditions, lasting significantly longer than the typical duration of a cold. The patient reports that the cough has been severe enough to cause blood in the mucus. The patient also experiences postnasal drip, which seems to exacerbate the cough.The cough is worse with cold weather, dry air, dust and wild fire smoke.  The patient has recently had pneumonia, which was treated with antibiotics. The patient was also given steroids, which seemed to provide significant relief from the cough. However, the cough returned as the dosage of steroids was reduced. The patient reports that this is the first year that the cough has been severe enough to disrupt sleep, which may have contributed to the development of pneumonia.  She denies any joint pains, muscle pains or skin rashes. She was seen by an allergist in [place]  with grossly negative skin pric allergy testing, negative FENO and normal spirometry. Absolute eosinophil counts ranging 100-200 in the past.   A recent CT scan revealed a calcified spot on the patient's lung. The patient has not had any previous chest x-rays for comparison. The patient does not smoke or vape and has no known allergies. The patient has not previously used an inhaler for the cough. 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   Family history - Father: fibromyalgia  OV 08/03/23 She was started on advair hfa 230-4.5mcg 2 puffs twice daily after last  visit with improved in her cough symptoms but she had increase in cough, mucous production and nasal congestion where she was treated with prednisone and doxycycline  for concern of bronchitis. These symptoms developed after a cruise/travels. She reports getting sick every time she travels.   She reports increase in muscle spasms since starting advair inhaler.   No prior history of frequent infections until last fall. Discussed referral to immunology for further evaluation but felt better to observe longer to monitor pattern of infections.  History reviewed. No pertinent past medical history.   Family History  Problem Relation Age of Onset   Alcohol abuse Father    Depression Father    Diabetes Maternal Grandmother    Breast cancer Paternal Grandmother    Cancer Paternal Grandmother        breast CA     Social History   Socioeconomic History   Marital status: Married    Spouse name: Not on file   Number of children: Not on file   Years of education: Not on file   Highest education level: Bachelor's degree (e.g., BA, AB, BS)  Occupational History   Not on file  Tobacco Use   Smoking status: Never    Passive exposure: Never   Smokeless tobacco: Never  Vaping Use   Vaping status: Never Used  Substance and Sexual Activity   Alcohol use: Yes    Alcohol/week: 1.0 standard drink of alcohol    Types: 1 Cans of beer per week    Comment: Occassionally.   Drug use: Never  Sexual activity: Yes    Birth control/protection: Condom, Pill    Comment: Married. One partner.  Other Topics Concern   Not on file  Social History Narrative   Not on file   Social Drivers of Health   Financial Resource Strain: Low Risk  (05/23/2023)   Overall Financial Resource Strain (CARDIA)    Difficulty of Paying Living Expenses: Not very hard  Food Insecurity: No Food Insecurity (05/23/2023)   Hunger Vital Sign    Worried About Running Out of Food in the Last Year: Never true    Ran Out of Food in  the Last Year: Never true  Transportation Needs: No Transportation Needs (05/23/2023)   PRAPARE - Administrator, Civil Service (Medical): No    Lack of Transportation (Non-Medical): No  Physical Activity: Insufficiently Active (05/23/2023)   Exercise Vital Sign    Days of Exercise per Week: 2 days    Minutes of Exercise per Session: 20 min  Stress: No Stress Concern Present (05/23/2023)   [employer] of Occupational Health - Occupational Stress Questionnaire    Feeling of Stress : Only a little  Social Connections: Socially Integrated (05/23/2023)   Social Connection and Isolation Panel [NHANES]    Frequency of Communication with Friends and Family: Twice a week    Frequency of Social Gatherings with Friends and Family: Twice a week    Attends Religious Services: More than 4 times per year    Active Member of [employer] or Organizations: Yes    Attends Engineer, structural: More than 4 times per year    Marital Status: Married  Catering manager Violence: Not on file     Allergies  Allergen Reactions   Sulfa Antibiotics Rash and Itching     Outpatient Medications Prior to Visit  Medication Sig Dispense Refill   benzonatate  (TESSALON ) 100 MG capsule Take 1 capsule (100 mg total) by mouth every 8 (eight) hours as needed for cough. 21 capsule 0   cetirizine (ZYRTEC) 10 MG tablet Take 10 mg by mouth daily.     guaiFENesin (MUCINEX) 600 MG 12 hr tablet Take 600 mg by mouth 2 (two) times daily as needed.     norgestimate -ethinyl estradiol  (MONO-LINYAH ) 0.25-35 MG-MCG tablet Take 1 tablet by mouth daily. 84 tablet 3   albuterol  (VENTOLIN  HFA) 108 (90 Base) MCG/ACT inhaler Inhale 2 puffs into the lungs every 6 (six) hours as needed for wheezing or shortness of breath. 8.5 g 6   fluticasone -salmeterol (ADVAIR HFA) 230-21 MCG/ACT inhaler Inhale 2 puffs into the lungs 2 (two) times daily. 36 g 3   No facility-administered medications prior to visit.   Review of Systems   Constitutional:  Negative for chills, fever, malaise/fatigue and weight loss.  HENT:  Negative for congestion and sinus pain.   Eyes: Negative.   Respiratory:  Positive for cough. Negative for hemoptysis, sputum production, shortness of breath and wheezing.   Cardiovascular:  Negative for chest pain, palpitations, orthopnea, claudication and leg swelling.  Gastrointestinal:  Negative for abdominal pain, heartburn, nausea and vomiting.  Genitourinary: Negative.   Musculoskeletal:  Negative for joint pain and myalgias.  Skin:  Negative for rash.  Neurological:  Negative for weakness.  Endo/Heme/Allergies: Negative.   Psychiatric/Behavioral: Negative.     Objective:   Vitals:   08/03/23 1107  BP: 120/84  Pulse: (!) 110  SpO2: 98%  Weight: 156 lb (70.8 kg)  Height: 5\' 5"  (1.651 m)   Physical Exam Constitutional:  General: She is not in acute distress.    Appearance: Normal appearance.  Eyes:     General: No scleral icterus.    Conjunctiva/sclera: Conjunctivae normal.  Cardiovascular:     Rate and Rhythm: Normal rate and regular rhythm.  Pulmonary:     Breath sounds: No wheezing, rhonchi or rales.  Musculoskeletal:     Right lower leg: No edema.     Left lower leg: No edema.  Skin:    General: Skin is warm and dry.  Neurological:     General: No focal deficit present.     CBC    Component Value Date/Time   WBC 7.7 08/02/2023 0832   WBC 8.7 02/16/2023 1147   RBC 5.09 08/02/2023 0832   RBC 4.74 02/16/2023 1147   HGB 15.0 08/02/2023 0832   HCT 46.9 (H) 08/02/2023 0832   PLT 256 08/02/2023 0832   MCV 92 08/02/2023 0832   MCH 29.5 08/02/2023 0832   MCH 29.2 01/06/2023 1831   MCHC 32.0 08/02/2023 0832   MCHC 33.9 02/16/2023 1147   RDW 13.0 08/02/2023 0832   LYMPHSABS 2.0 08/02/2023 0832   MONOABS 0.5 02/16/2023 1147   EOSABS 0.2 08/02/2023 0832   BASOSABS 0.1 08/02/2023 0832      Latest Ref Rng & Units 08/02/2023    8:32 AM 01/06/2023    6:31 PM 06/30/2022     9:26 AM  BMP  Glucose 70 - 99 mg/dL 76  161  85   BUN 6 - 24 mg/dL 10  9  9    Creatinine 0.57 - 1.00 mg/dL 0.96  0.45  4.09   BUN/Creat Ratio 9 - 23 11   10    Sodium 134 - 144 mmol/L 140  135  141   Potassium 3.5 - 5.2 mmol/L 4.0  3.8  4.1   Chloride 96 - 106 mmol/L 103  105  103   CO2 20 - 29 mmol/L 19  21  19    Calcium 8.7 - 10.2 mg/dL 9.2  8.7  9.5    Chest imaging: CT Chest 01/06/23 Cardiovascular: Nonaneurysmal aorta. Normal cardiac size. No pericardial effusion   Mediastinum/Nodes: Midline trachea. No thyroid mass. No suspicious lymph nodes. Esophagus within normal limits   Lungs/Pleura: No pleural effusion or pneumothorax. Heterogeneous patchy consolidation in the left upper lobe consistent with pneumonia. 1.7 x 1.4 cm largely ossified nodule in the right upper lobe corresponding to radiographic abnormality.  PFT:    Latest Ref Rng & Units 08/03/2023    9:51 AM  PFT Results  FVC-Pre L 3.64  P  FVC-Predicted Pre % 96  P  FVC-Post L 3.84  P  FVC-Predicted Post % 101  P  Pre FEV1/FVC % % 83  P  Post FEV1/FCV % % 83  P  FEV1-Pre L 3.02  P  FEV1-Predicted Pre % 98  P  FEV1-Post L 3.17  P  DLCO uncorrected ml/min/mmHg 26.38  P  DLCO UNC% % 117  P  DLCO corrected ml/min/mmHg 25.22  P  DLCO COR %Predicted % 112  P  DLVA Predicted % 125  P  TLC L 5.31  P  TLC % Predicted % 102  P  RV % Predicted % 102  P    P Preliminary result    Labs:  Path:  Echo:  Heart Catheterization:     Assessment & Plan:   Asthma, cough variant - Plan: fluticasone -salmeterol (ADVAIR HFA) 115-21 MCG/ACT inhaler, albuterol  (VENTOLIN  HFA) 108 (90 Base) MCG/ACT inhaler  Lung nodule - Plan: CT Chest Wo Contrast  Discussion: Brandy Padilla is a 43 year old woman, never smoker who 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   Cough Variant Asthma Longstanding history of cough, exacerbated by colds and lasting longer than typical. Recent episode of bronchitis and  pneumonia last year. Overall symptoms better with inhaler therapy. Breathing tests are within normal limits.  - reduce dose of advair to 115-4.5mcg 2 puffs twice daily from 230-4.5mcg - monitor for reduction in muscle spasm side effects - monitor for recurrent respiratory exacerbations, consider referral to immunology to evaluate for immunodeficiency  Calcified Spot on Lung Identified on recent CT chest. Likely chronic. -Monitor with future CT scan in October 2025  Follow-up in 6 months.  Duaine German, MD [facility] Office: [phone]   Current Outpatient Medications:    albuterol  (VENTOLIN  HFA) 108 (90 Base) MCG/ACT inhaler, Inhale 1-2 puffs into the lungs every 6 (six) hours as needed for wheezing or shortness of breath., Disp: 8 g, Rfl: 6   benzonatate  (TESSALON ) 100 MG capsule, Take 1 capsule (100 mg total) by mouth every 8 (eight) hours as needed for cough., Disp: 21 capsule, Rfl: 0   cetirizine (ZYRTEC) 10 MG tablet, Take 10 mg by mouth daily., Disp: , Rfl:    fluticasone -salmeterol (ADVAIR HFA) 115-21 MCG/ACT inhaler, Inhale 2 puffs into the lungs 2 (two) times daily., Disp: 36 g, Rfl: 3   guaiFENesin (MUCINEX) 600 MG 12 hr tablet, Take 600 mg by mouth 2 (two) times daily as needed., Disp: , Rfl:    norgestimate -ethinyl estradiol  (MONO-LINYAH ) 0.25-35 MG-MCG tablet, Take 1 tablet by mouth daily., Disp: 84 tablet, Rfl: 3

## 2023-08-04 ENCOUNTER — Ambulatory Visit: Payer: Self-pay | Admitting: Family Medicine

## 2023-11-23 ENCOUNTER — Encounter: Payer: Self-pay | Admitting: Pulmonary Disease

## 2023-11-24 ENCOUNTER — Telehealth: Payer: Self-pay

## 2023-12-13 ENCOUNTER — Ambulatory Visit
Admission: RE | Admit: 2023-12-13 | Discharge: 2023-12-13 | Disposition: A | Source: Ambulatory Visit | Attending: Pulmonary Disease

## 2023-12-13 DIAGNOSIS — R911 Solitary pulmonary nodule: Secondary | ICD-10-CM

## 2023-12-25 ENCOUNTER — Ambulatory Visit: Payer: Self-pay | Admitting: Pulmonary Disease

## 2024-01-16 ENCOUNTER — Encounter: Payer: Self-pay | Admitting: Pulmonary Disease

## 2024-01-16 ENCOUNTER — Ambulatory Visit: Admitting: Pulmonary Disease

## 2024-01-16 VITALS — BP 117/62 | HR 58 | Ht 65.0 in | Wt 157.4 lb

## 2024-01-16 DIAGNOSIS — J45991 Cough variant asthma: Secondary | ICD-10-CM

## 2024-01-16 DIAGNOSIS — J449 Chronic obstructive pulmonary disease, unspecified: Secondary | ICD-10-CM

## 2024-01-16 DIAGNOSIS — R911 Solitary pulmonary nodule: Secondary | ICD-10-CM

## 2024-01-16 MED ORDER — MONTELUKAST SODIUM 10 MG PO TABS: 10.0000 mg | ORAL_TABLET | Freq: Every day | ORAL | 1 refills | Status: AC

## 2024-01-16 NOTE — Patient Instructions (Addendum)
 Continue advair 115-4.5mcg 2 puffs twice daily - rinse mouth out after each use  Use albuterol  inhaler 1-2 puffs every 4-6 hours  as needed  Start montelukast 10mg  daily in the evening time  Will consider starting flonase or nasocort next for post-nasal drip  If cough continues we can consider changing Advair to Breztri inhaler  Follow up in 1 year

## 2024-01-16 NOTE — Progress Notes (Signed)
 Established Patient Pulmonology Office Visit   Subjective:  Patient ID: Brandy Padilla, female    DOB: Jul 24, 1980  MRN: [id number]  CC:  Chief Complaint  Patient presents with   Medical Management of Chronic Issues    Pt states Advair not working as well     Discussed the use of AI scribe software for clinical note transcription with the patient, who gave verbal consent to proceed.  History of Present Illness Brandy Padilla is a 43 year old female with cough variant asthma who presents for a follow-up visit for asthma management and a CT scan for a lung nodule.  Her asthma symptoms have worsened with the change in season, despite a previous reduction in her inhaler dose. She uses Zyrtec daily and Mucinex to manage her cough. She has not required antibiotics or prednisone since March. Muscle cramps have decreased in frequency with the lower inhaler dose. She occasionally expectorates mucus, usually white or clear, and sometimes yellow. No sinus congestion, but there is postnasal drip.  A follow-up CT scan was performed for a lung nodule in the posterior right upper lobe, which is densely calcified and partially cavitary, measuring 2 x 1.4 cm.      Review of Systems  Constitutional:  Negative for chills, fever, malaise/fatigue and weight loss.  HENT:  Negative for congestion, sinus pain and sore throat.   Eyes: Negative.   Respiratory:  Positive for cough. Negative for hemoptysis, sputum production, shortness of breath and wheezing.   Cardiovascular:  Negative for chest pain, palpitations, orthopnea, claudication and leg swelling.  Gastrointestinal:  Negative for abdominal pain, heartburn, nausea and vomiting.  Genitourinary: Negative.   Musculoskeletal:  Negative for joint pain and myalgias.  Skin:  Negative for rash.  Neurological:  Negative for weakness.  Endo/Heme/Allergies: Negative.   Psychiatric/Behavioral: Negative.        Current Outpatient  Medications:    albuterol  (VENTOLIN  HFA) 108 (90 Base) MCG/ACT inhaler, Inhale 1-2 puffs into the lungs every 6 (six) hours as needed for wheezing or shortness of breath., Disp: 8 g, Rfl: 6   benzonatate  (TESSALON ) 100 MG capsule, Take 1 capsule (100 mg total) by mouth every 8 (eight) hours as needed for cough., Disp: 21 capsule, Rfl: 0   cetirizine (ZYRTEC) 10 MG tablet, Take 10 mg by mouth daily., Disp: , Rfl:    fluticasone -salmeterol (ADVAIR HFA) 115-21 MCG/ACT inhaler, Inhale 2 puffs into the lungs 2 (two) times daily., Disp: 36 g, Rfl: 3   guaiFENesin (MUCINEX) 600 MG 12 hr tablet, Take 600 mg by mouth 2 (two) times daily as needed., Disp: , Rfl:    montelukast (SINGULAIR) 10 MG tablet, Take 1 tablet (10 mg total) by mouth at bedtime., Disp: 90 tablet, Rfl: 1   norgestimate -ethinyl estradiol  (MONO-LINYAH ) 0.25-35 MG-MCG tablet, Take 1 tablet by mouth daily., Disp: 84 tablet, Rfl: 3      Objective:  BP 117/62   Pulse (!) 58   Ht 5' 5 (1.651 m) Comment: per pt  Wt 157 lb 6.4 oz (71.4 kg)   SpO2 100%   BMI 26.19 kg/m     Physical Exam Constitutional:      General: She is not in acute distress.    Appearance: Normal appearance.  Eyes:     General: No scleral icterus.    Conjunctiva/sclera: Conjunctivae normal.  Cardiovascular:     Rate and Rhythm: Normal rate and regular rhythm.  Pulmonary:     Breath sounds: No wheezing, rhonchi or  rales.  Musculoskeletal:     Right lower leg: No edema.     Left lower leg: No edema.  Skin:    General: Skin is warm and dry.  Neurological:     General: No focal deficit present.    Diagnostic Review:    CT Chest 12/13/23 1. Unchanged, densely calcified and partially cavitary nodule in the posterior right upper lobe measuring 2.0 x 1.4 cm. This is generally most consistent with benign sequelae of prior granulomatous infection. 2. No acute findings in the chest.    Assessment & Plan:   Assessment & Plan Asthma, cough  variant  Orders:   montelukast (SINGULAIR) 10 MG tablet; Take 1 tablet (10 mg total) by mouth at bedtime.  Lung nodule      Assessment and Plan Assessment & Plan Cough variant asthma Symptoms exacerbated by seasonal changes. Current treatment includes reduced Advair dose, decreasing muscle cramps but not fully controlling symptoms. Mucinex and Zyrtec used for symptom management. No recent antibiotics or prednisone needed. Mucus production not indicative of infection. Considered montelukast for airway inflammation and potential switch to Breztri inhaler if symptoms persist. Discussed nasal spray for post-nasal drip. - Prescribed montelukast (Singulair) once daily in the evening. - Provided Breztri inhaler samples for trial. - Prescribed Flonase nasal spray for post-nasal drip. - Instructed to monitor symptoms and report in 2-4 weeks to assess montelukast effectiveness. - Advised to contact clinic if symptoms worsen or antibiotics/steroids are needed.  Calcified Lung Nodule Densely calcified, partially cavitary nodule in posterior right upper lobe, 2 x 1.4 cm, unchanged from last year's scan. No growth observed, reassuring against malignancy. Calcifications atypical for cancer. - No further follow-up imaging required unless respiratory symptoms worsen.   Follow up in 1 year  Dorn KATHEE Chill, MD

## 2024-01-24 ENCOUNTER — Encounter: Payer: Self-pay | Admitting: Pulmonary Disease

## 2024-02-09 NOTE — Telephone Encounter (Signed)
 NFN

## 2024-08-01 ENCOUNTER — Encounter: Admitting: Family Medicine
